# Patient Record
Sex: Female | Born: 1945 | Race: White | Hispanic: No | Smoking: Never smoker
Health system: Southern US, Community
[De-identification: ages and names within clinical notes are randomized; demographics above are authoritative.]

## PROBLEM LIST (undated history)

## (undated) DIAGNOSIS — J309 Allergic rhinitis, unspecified: Secondary | ICD-10-CM

## (undated) DIAGNOSIS — K859 Acute pancreatitis without necrosis or infection, unspecified: Secondary | ICD-10-CM

## (undated) DIAGNOSIS — M1990 Unspecified osteoarthritis, unspecified site: Secondary | ICD-10-CM

## (undated) DIAGNOSIS — E669 Obesity, unspecified: Secondary | ICD-10-CM

## (undated) DIAGNOSIS — E785 Hyperlipidemia, unspecified: Secondary | ICD-10-CM

## (undated) DIAGNOSIS — B019 Varicella without complication: Secondary | ICD-10-CM

## (undated) HISTORY — DX: Acute pancreatitis without necrosis or infection, unspecified: K85.90

## (undated) HISTORY — PX: JOINT REPLACEMENT: SHX530

## (undated) HISTORY — PX: EYE SURGERY: SHX253

## (undated) HISTORY — PX: TUBAL LIGATION: SHX77

## (undated) HISTORY — PX: COLONOSCOPY: SHX174

---

## 1999-11-09 ENCOUNTER — Other Ambulatory Visit: Admission: RE | Admit: 1999-11-09 | Discharge: 1999-11-09 | Payer: Self-pay | Admitting: Family Medicine

## 2004-11-14 ENCOUNTER — Ambulatory Visit: Payer: Self-pay | Admitting: Family Medicine

## 2006-01-31 ENCOUNTER — Ambulatory Visit: Payer: Self-pay | Admitting: Family Medicine

## 2006-06-14 ENCOUNTER — Ambulatory Visit: Payer: Self-pay | Admitting: Gastroenterology

## 2007-06-04 ENCOUNTER — Ambulatory Visit: Payer: Self-pay | Admitting: Family Medicine

## 2008-02-04 ENCOUNTER — Ambulatory Visit: Payer: Self-pay | Admitting: Family Medicine

## 2008-06-08 ENCOUNTER — Ambulatory Visit: Payer: Self-pay | Admitting: Family Medicine

## 2009-06-09 ENCOUNTER — Ambulatory Visit: Payer: Self-pay | Admitting: Family Medicine

## 2009-11-12 ENCOUNTER — Ambulatory Visit: Payer: Self-pay | Admitting: Unknown Physician Specialty

## 2009-11-17 ENCOUNTER — Ambulatory Visit: Payer: Self-pay | Admitting: Unknown Physician Specialty

## 2010-06-21 ENCOUNTER — Ambulatory Visit: Payer: Self-pay | Admitting: Ophthalmology

## 2010-07-12 ENCOUNTER — Ambulatory Visit: Payer: Self-pay | Admitting: Family Medicine

## 2011-08-23 ENCOUNTER — Ambulatory Visit: Payer: Self-pay | Admitting: Family Medicine

## 2011-09-06 ENCOUNTER — Ambulatory Visit: Payer: Self-pay | Admitting: Physical Medicine and Rehabilitation

## 2011-09-08 ENCOUNTER — Other Ambulatory Visit: Payer: Self-pay | Admitting: Internal Medicine

## 2011-09-12 ENCOUNTER — Other Ambulatory Visit: Payer: Self-pay | Admitting: Physical Medicine and Rehabilitation

## 2011-09-12 DIAGNOSIS — M545 Low back pain: Secondary | ICD-10-CM

## 2011-09-17 ENCOUNTER — Ambulatory Visit
Admission: RE | Admit: 2011-09-17 | Discharge: 2011-09-17 | Disposition: A | Discharge status: Home / Self Care | Payer: BC Managed Care – PPO | Source: Ambulatory Visit | Attending: Physical Medicine and Rehabilitation | Admitting: Physical Medicine and Rehabilitation

## 2011-09-17 DIAGNOSIS — M545 Low back pain: Secondary | ICD-10-CM

## 2012-12-09 ENCOUNTER — Ambulatory Visit: Payer: Self-pay | Admitting: Family Medicine

## 2013-07-30 ENCOUNTER — Ambulatory Visit: Payer: Self-pay | Admitting: General Practice

## 2013-07-30 LAB — URINALYSIS, COMPLETE
BACTERIA: NONE SEEN
BLOOD: NEGATIVE
Bilirubin,UR: NEGATIVE
Glucose,UR: NEGATIVE mg/dL (ref 0–75)
Hyaline Cast: 4
Ketone: NEGATIVE
Leukocyte Esterase: NEGATIVE
NITRITE: NEGATIVE
Ph: 7 (ref 4.5–8.0)
Protein: NEGATIVE
RBC,UR: 1 /HPF (ref 0–5)
SPECIFIC GRAVITY: 1.012 (ref 1.003–1.030)
WBC UR: 1 /HPF (ref 0–5)

## 2013-07-30 LAB — BASIC METABOLIC PANEL
ANION GAP: 6 — AB (ref 7–16)
BUN: 12 mg/dL (ref 7–18)
CALCIUM: 9.6 mg/dL (ref 8.5–10.1)
Chloride: 96 mmol/L — ABNORMAL LOW (ref 98–107)
Co2: 29 mmol/L (ref 21–32)
Creatinine: 0.78 mg/dL (ref 0.60–1.30)
EGFR (African American): >60
GLUCOSE: 101 mg/dL — AB (ref 65–99)
OSMOLALITY: 263 (ref 275–301)
Potassium: 3.3 mmol/L — ABNORMAL LOW (ref 3.5–5.1)
SODIUM: 131 mmol/L — AB (ref 136–145)

## 2013-07-30 LAB — CBC
HCT: 39.7 % (ref 35.0–47.0)
HGB: 13.5 g/dL (ref 12.0–16.0)
MCH: 30.6 pg (ref 26.0–34.0)
MCHC: 33.9 g/dL (ref 32.0–36.0)
MCV: 90 fL (ref 80–100)
Platelet: 355 10*3/uL (ref 150–440)
RBC: 4.4 10*6/uL (ref 3.80–5.20)
RDW: 12.5 % (ref 11.5–14.5)
WBC: 6.9 10*3/uL (ref 3.6–11.0)

## 2013-07-30 LAB — PROTIME-INR
INR: 1
PROTHROMBIN TIME: 12.6 s (ref 11.5–14.7)

## 2013-07-30 LAB — SEDIMENTATION RATE: ERYTHROCYTE SED RATE: 25 mm/h (ref 0–30)

## 2013-07-30 LAB — APTT: Activated PTT: 33.3 secs (ref 23.6–35.9)

## 2013-07-30 LAB — MRSA PCR SCREENING

## 2013-07-31 LAB — URINE CULTURE

## 2013-08-11 ENCOUNTER — Inpatient Hospital Stay: Payer: Self-pay | Admitting: General Practice

## 2013-08-11 LAB — POTASSIUM: Potassium: 4.6 mmol/L (ref 3.5–5.1)

## 2013-08-12 LAB — BASIC METABOLIC PANEL
ANION GAP: 5 — AB (ref 7–16)
BUN: 14 mg/dL (ref 7–18)
CREATININE: 0.92 mg/dL (ref 0.60–1.30)
Calcium, Total: 8.3 mg/dL — ABNORMAL LOW (ref 8.5–10.1)
Chloride: 104 mmol/L (ref 98–107)
Co2: 26 mmol/L (ref 21–32)
GLUCOSE: 146 mg/dL — AB (ref 65–99)
Osmolality: 273 (ref 275–301)
Potassium: 5 mmol/L (ref 3.5–5.1)
Sodium: 135 mmol/L — ABNORMAL LOW (ref 136–145)

## 2013-08-12 LAB — HEMOGLOBIN: HGB: 8.7 g/dL — ABNORMAL LOW (ref 12.0–16.0)

## 2013-08-12 LAB — PLATELET COUNT: Platelet: 222 10*3/uL (ref 150–440)

## 2013-08-13 LAB — BASIC METABOLIC PANEL
ANION GAP: 6 — AB (ref 7–16)
BUN: 7 mg/dL (ref 7–18)
Calcium, Total: 8.8 mg/dL (ref 8.5–10.1)
Chloride: 104 mmol/L (ref 98–107)
Co2: 26 mmol/L (ref 21–32)
Creatinine: 0.59 mg/dL — ABNORMAL LOW (ref 0.60–1.30)
EGFR (African American): >60
EGFR (Non-African Amer.): >60
Glucose: 109 mg/dL — ABNORMAL HIGH (ref 65–99)
OSMOLALITY: 271 (ref 275–301)
Potassium: 4.1 mmol/L (ref 3.5–5.1)
SODIUM: 136 mmol/L (ref 136–145)

## 2013-08-13 LAB — HEMOGLOBIN: HGB: 7.1 g/dL — AB (ref 12.0–16.0)

## 2013-08-13 LAB — PLATELET COUNT: PLATELETS: 173 10*3/uL (ref 150–440)

## 2013-08-14 ENCOUNTER — Encounter: Payer: Self-pay | Admitting: Internal Medicine

## 2013-08-14 LAB — HEMOGLOBIN
HGB: 8 g/dL — AB (ref 12.0–16.0)
HGB: 9.4 g/dL — ABNORMAL LOW (ref 12.0–16.0)

## 2013-08-15 ENCOUNTER — Encounter: Payer: Self-pay | Admitting: Internal Medicine

## 2013-08-15 LAB — PATHOLOGY REPORT

## 2013-08-19 ENCOUNTER — Ambulatory Visit: Payer: Self-pay | Admitting: Gerontology

## 2013-10-16 ENCOUNTER — Ambulatory Visit: Payer: Self-pay | Admitting: General Practice

## 2013-10-16 LAB — URINALYSIS, COMPLETE
Bacteria: NONE SEEN
Bilirubin,UR: NEGATIVE
Blood: NEGATIVE
Glucose,UR: NEGATIVE mg/dL (ref 0–75)
KETONE: NEGATIVE
Leukocyte Esterase: NEGATIVE
Nitrite: NEGATIVE
Ph: 6 (ref 4.5–8.0)
Protein: NEGATIVE
RBC,UR: <1 /HPF (ref 0–5)
Specific Gravity: 1.021 (ref 1.003–1.030)
WBC UR: NONE SEEN /HPF (ref 0–5)

## 2013-10-16 LAB — CBC
HCT: 38.8 % (ref 35.0–47.0)
HGB: 12.9 g/dL (ref 12.0–16.0)
MCH: 30.5 pg (ref 26.0–34.0)
MCHC: 33.3 g/dL (ref 32.0–36.0)
MCV: 92 fL (ref 80–100)
PLATELETS: 335 10*3/uL (ref 150–440)
RBC: 4.24 10*6/uL (ref 3.80–5.20)
RDW: 14 % (ref 11.5–14.5)
WBC: 5.6 10*3/uL (ref 3.6–11.0)

## 2013-10-16 LAB — BASIC METABOLIC PANEL
ANION GAP: 6 — AB (ref 7–16)
BUN: 13 mg/dL (ref 7–18)
CALCIUM: 9.8 mg/dL (ref 8.5–10.1)
CHLORIDE: 101 mmol/L (ref 98–107)
CREATININE: 0.73 mg/dL (ref 0.60–1.30)
Co2: 27 mmol/L (ref 21–32)
EGFR (Non-African Amer.): >60
GLUCOSE: 92 mg/dL (ref 65–99)
OSMOLALITY: 268 (ref 275–301)
Potassium: 4.1 mmol/L (ref 3.5–5.1)
Sodium: 134 mmol/L — ABNORMAL LOW (ref 136–145)

## 2013-10-16 LAB — SEDIMENTATION RATE: Erythrocyte Sed Rate: 26 mm/hr (ref 0–30)

## 2013-10-16 LAB — PROTIME-INR
INR: 1
Prothrombin Time: 12.6 secs (ref 11.5–14.7)

## 2013-10-16 LAB — APTT: Activated PTT: 31.1 secs (ref 23.6–35.9)

## 2013-10-16 LAB — MRSA PCR SCREENING

## 2013-10-17 LAB — URINE CULTURE

## 2013-10-27 ENCOUNTER — Inpatient Hospital Stay: Payer: Self-pay | Admitting: General Practice

## 2013-10-28 LAB — BASIC METABOLIC PANEL
Anion Gap: 8 (ref 7–16)
BUN: 9 mg/dL (ref 7–18)
Calcium, Total: 8 mg/dL — ABNORMAL LOW (ref 8.5–10.1)
Chloride: 102 mmol/L (ref 98–107)
Co2: 23 mmol/L (ref 21–32)
Creatinine: 0.73 mg/dL (ref 0.60–1.30)
EGFR (Non-African Amer.): >60
Glucose: 93 mg/dL (ref 65–99)
OSMOLALITY: 265 (ref 275–301)
POTASSIUM: 3.9 mmol/L (ref 3.5–5.1)
Sodium: 133 mmol/L — ABNORMAL LOW (ref 136–145)

## 2013-10-28 LAB — PLATELET COUNT: PLATELETS: 222 10*3/uL (ref 150–440)

## 2013-10-28 LAB — HEMOGLOBIN: HGB: 8.5 g/dL — AB (ref 12.0–16.0)

## 2013-10-29 LAB — BASIC METABOLIC PANEL
Anion Gap: 5 — ABNORMAL LOW (ref 7–16)
BUN: 5 mg/dL — ABNORMAL LOW (ref 7–18)
CHLORIDE: 106 mmol/L (ref 98–107)
CO2: 27 mmol/L (ref 21–32)
CREATININE: 0.62 mg/dL (ref 0.60–1.30)
Calcium, Total: 8.5 mg/dL (ref 8.5–10.1)
EGFR (African American): >60
EGFR (Non-African Amer.): >60
GLUCOSE: 106 mg/dL — AB (ref 65–99)
Osmolality: 273 (ref 275–301)
Potassium: 3.8 mmol/L (ref 3.5–5.1)
SODIUM: 138 mmol/L (ref 136–145)

## 2013-10-29 LAB — PATHOLOGY REPORT

## 2013-10-29 LAB — HEMOGLOBIN: HGB: 8.9 g/dL — ABNORMAL LOW (ref 12.0–16.0)

## 2013-10-29 LAB — PLATELET COUNT: PLATELETS: 216 10*3/uL (ref 150–440)

## 2014-04-15 ENCOUNTER — Ambulatory Visit: Payer: Self-pay | Admitting: Family Medicine

## 2014-08-08 NOTE — Discharge Summary (Signed)
PATIENT NAME:  Ellen [name], Ellen [name] MR#:  [id number] DATE OF BIRTH:  1945/12/29  DATE OF ADMISSION:  08/11/2013 DATE OF DISCHARGE: 08/14/2013.   ADMITTING DIAGNOSIS: Degenerative arthrosis of the left hip.   DISCHARGE DIAGNOSIS: Degenerative arthrosis of the right hip.   HISTORY: The patient is a 69 year old female who has been followed at [facility] for progression of left hip pain. She states she has not seen any change in her condition in quite a while. She had reported progressive decrease in her left hip range of motion. She had reported pain with weight-bearing activities. She was having difficulty with activities such as donning and doffing of her shoes and socks. She was having significant difficulty with stair ambulation. She did not see any significant improvement despite intra-articular cortisone injections. She states that the pain had increased to the point that it was significantly interfering with her activities of daily living. [facility] taken in [facility] showed severe degenerative changes with bone-on-bone articulation being noted, as well as some subchondral sclerosis. After discussion of the risks and benefits of surgical intervention, the patient expressed her understanding of the risks and benefits and agreed for plans for surgical intervention.   PROCEDURE: Left total hip arthroplasty.   ANESTHESIA: Spinal.   IMPLANTS UTILIZED: DePuy 13.5 mm small stature AML femoral stem, 50 mm outer diameter Pinnacle 100 acetabular component, +4 mm neutral Pinnacle Marathon polyethylene liner, and a 32 mm cobalt chrome hip ball with a +1 mm neck length.   HOSPITAL COURSE: The patient tolerated the procedure very well. She had no complications. She was then taken to the PAC-U where she was stabilized and then transferred to the orthopedic floor. She began receiving anticoagulation therapy of 30 mg subcutaneous every 12 hours per anesthesia and pharmacy protocol. She was fitted with TED  stockings bilaterally. These were allowed to be removed one hour per eight hour shift. The patient was also fitted with AVI compression foot pumps set at 80 mmHg. Her calves have been nontender. There has been no evidence of any DVTs. Negative Homans sign. Heels were elevated off the bed using rolled towels.   The patient has denied any chest pain or shortness of breath. Vital signs for the most part have been unremarkable and stable. However, she was noted to run some tachycardic secondary to low hemoglobin. Hemodynamically, she did require 2 units of packed RBCs. She was noted be lightheaded and was noted to have some slight tachycardia. After transfusion, she was noted to have improvement in her condition.   Physical therapy was initiated on day one for gait training and transfers. She has progressed very slowly. Secondary to deconditioning. Occupational therapy was also initiated on day one for activities of daily living and assistive devices. There has been no complications.   The patient's IV, Foley and Hemovac were discontinued on day two along with a dressing change. There was no signs of any infection. She was noted to have quite a bit of bruising to the left hip.   ____________________________ Ellen ClinesJon Ishmail Mcmanamon, PA jrw:sg D: 08/14/2013 08:07:33 ET [name]: 08/14/2013 09:00:04 ET JOB#: [id number]  cc: Ellen ClinesJon Tymesha Ditmore, PA, <Dictator> Ellen Kanan PA ELECTRONICALLY SIGNED 08/19/2013 21:41

## 2014-08-08 NOTE — Op Note (Signed)
PATIENT NAME:  Ellen [name], Ellen [name] MR#:  [id number] DATE OF BIRTH:  [birth year]  DATE OF PROCEDURE:  10/27/2013  PREOPERATIVE DIAGNOSIS: Degenerative arthrosis of the right hip.   POSTOPERATIVE DIAGNOSIS: Degenerative arthrosis of the right hip.   PROCEDURE PERFORMED: Right total hip arthroplasty.   SURGEON:  Dr. Francesco Sor  ASSISTANT:  Van Clines, PA (required to maintain retraction throughout the procedure).   ANESTHESIA: Spinal.   ESTIMATED BLOOD LOSS: 400 mL.   FLUIDS REPLACED: 2300 mL of crystalloid.   DRAINS: Two medium drains to Hemovac reservoir.   IMPLANTS UTILIZED: DePuy 13.5 mm small stature AML femoral stem, 50 mm outer diameter Pinnacle 100 acetabular component, neutral Pinnacle ALTRX polyethylene liner, and a 32 mm cobalt chrome hip ball with a +1 mm neck length.   INDICATIONS FOR SURGERY: The patient is a 69 year old female who has been seen for complaints of progressive right hip and groin pain. X-rays demonstrate severe degenerative changes with full-thickness loss of articular cartilage. She had previously undergone successful left total hip arthroplasty. After discussion of the risks and benefits of surgical intervention, the patient expressed understanding of the risks and benefits and agreed with plans for surgical intervention.   PROCEDURE IN DETAIL: The patient was brought to the operating room, and after adequate spinal anesthesia was achieved, the patient was placed in the left lateral decubitus position. Axillary roll was placed and all bony prominences were well padded. The patient's right hip and leg were cleaned and prepped with alcohol and DuraPrep, draped in the usual sterile fashion. A "timeout" was performed as per usual protocol. A lateral curvilinear incision was made gently curving towards the posterior superior iliac spine. IT band was incised in line with the skin incision. Fibers of the gluteus maximus were split in line. Piriformis tendon was identified,  skeletonized, and incised at its insertion at the proximal femur and reflected posteriorly. There is some similar fashion, short external rotators were incised and reflected posteriorly. A T-type posterior capsulotomy was performed. A large joint effusion was evacuated. Prior to dislocation of the femoral head, a threaded Steinmann pin was inserted through a separate stab incision into the pelvis superior to the acetabulum and then in the form of a stylus so as to assess limb length and hip offset throughout the procedure. The femoral head was then dislocated posteriorly. Severe degenerative changes were noted with full-thickness loss of articular cartilage and cyst formation. The femoral neck cut was performed using an oscillating saw.  Anterior capsule was elevated off the femoral neck. Inspection of the acetabulum also demonstrated significant degenerative changes with some superficial cystic changes noted. The labrum was excised. The acetabulum was reamed in a sequential fashion up to a 49 mm diameter. Good punctate bleeding bone was encountered. A 50 mm outer diameter Pinnacle 100 acetabular component was positioned and impacted into place. Good scratch fit was appreciated. A +4 mm neutral polyethylene was inserted and attention was directed to the proximal femur.  Pilot hole for reaming of the proximal femoral canal was created using a high-speed bur. Proximal femoral canal was reamed in a sequential fashion up to a 13 mm diameter. This was allowed for approximately 7.5 cm of scratch fit. It was thus elected to ream line to line to a 13.5 mm diameter using T handle.  The proximal femur was prepared using 13.5 mm aggressive side-biting reamer. Serial broaches were inserted up to a 13.5 mm small stature broach.   Calcar region was planed and trial reduction was  attempted.  It was felt that the limb length was too long and the broach was countersunk  with calcar region again planed. The +4 mm neutral polyethylene  was replaced with a neutral polyethylene. A 32 mm hip ball with a +1 mm neck length was placed on the trunnion and was reduced. Good equalization of limb lengths was appreciated and good hip offset was noted. Excellent stability was noted both anteriorly and posteriorly.   A hip ball was removed. The acetabular shell was irrigated and suctioned dry. A 32 mm inner diameter neutral ALTRX polyethylene liner was positioned and impacted into place. Next, a 13.5 mm small stature AML femoral component was positioned and impacted into place. Excellent scratch fit was appreciated. The Morse taper was cleaned and dried. A 32 mm cobalt chrome hip ball with a +1 mm neck length was placed on the trunnion and impacted into place. The hip was then reduced. Again, good equalization of limb lengths was noted and appropriate hip offset was appreciated. Excellent stability was noted both anteriorly and posteriorly. The wound was irrigated with copious amounts of normal saline with antibiotic solution using pulsatile lavage and then suctioned dry. Good hemostasis was appreciated. The posterior capsulotomy was repaired using #5 Ethibond. The piriformis tendon was reapproximated on the undersurface of the gluteus medius tendon using #5 Ethibond. Two medium drains were placed in the wound bed and brought out through a separate stab incision to be attached to a Hemovac reservoir. IT band was repaired using interrupted sutures of #1 Vicryl. The subcutaneous tissue was approximated in layers using first #0 Vicryl, followed by 2-0 Vicryl. Skin was closed with skin staples.  A sterile dressing was applied.   The patient tolerated the procedure well. She was transported to the recovery room in stable condition.   ____________________________ Illene LabradorJames P. Angie FavaHooten Jr., MD jph:dd D: 10/27/2013 10:53:54 ET T: 10/27/2013 20:20:12 ET JOB#: [id number]  cc: Illene LabradorJames P. Angie FavaHooten Jr., MD, <Dictator> Illene LabradorJAMES P Angie FavaHOOTEN JR MD ELECTRONICALLY SIGNED 11/04/2013  19:44

## 2014-08-08 NOTE — Op Note (Signed)
PATIENT NAME:  Ellen [name], Ellen [name] MR#:  [id number] DATE OF BIRTH:  1945-11-26  DATE OF PROCEDURE:  08/11/2013  PREOPERATIVE DIAGNOSIS: Degenerative arthrosis of the left hip.   POSTOPERATIVE DIAGNOSIS: Degenerative arthrosis of the left hip.   PROCEDURE PERFORMED: Left total hip arthroplasty.   SURGEON: Illene LabradorJames P. Hooten, MD   ASSISTANT: Hamilton CapriJohn Wolfe, PA (required to maintain retraction throughout the procedure)   ANESTHESIA: Spinal.   ESTIMATED BLOOD LOSS: 350 mL.   FLUIDS REPLACED: 1500 mL of crystalloid.   DRAINS: Two medium drains to Hemovac reservoir.   IMPLANTS UTILIZED: DePuy 13.5 mm small stature AML femoral stem, 50 mm outer diameter Pinnacle 100 acetabular component, +4 mm neutral Pinnacle Marathon polyethylene liner, and a 32 mm cobalt chrome hip ball with a +1 mm neck length.   INDICATIONS FOR SURGERY: The patient is a 69 year old female who has been seen for complaints of progressive left hip and groin pain. X-rays demonstrated severe degenerative changes. After discussion of the risks and benefits of surgical intervention, the patient expressed understanding of the risks and benefits and agreed with plans for surgical intervention.   PROCEDURE IN DETAIL: The patient was brought to the operating room and, after adequate spinal anesthesia was achieved, the patient was placed in a right lateral decubitus position. Axillary roll was placed and all bony prominences were well padded. The patient's left hip and leg were cleaned and prepped with alcohol and DuraPrep and draped in the usual sterile fashion. A "timeout" was performed as per usual protocol. A lateral curvilinear incision was made gently curving towards the posterior superior iliac spine. IT band was incised in line with the skin incision and fibers of the gluteus maximus were split in line. Piriformis tendon was identified, skeletonized, and incised at its insertion on the proximal femur and reflected posteriorly. In a similar  fashion, the short external rotators were incised and reflected posteriorly. A [name]-type posterior capsulotomy was performed. Prior to dislocation of the femoral head, a threaded Steinmann pin was inserted through a separate stab incision into the pelvis superior to the acetabulum and bent in the form of a stylus so as to assess limb length and hip offset throughout the procedure. The femoral head was then dislocated posteriorly. Severe degenerative changes were noted with full-thickness loss of articular cartilage. The femoral neck cut was performed using an oscillating saw. The anterior capsule was elevated off of the femoral neck. Inspection of the acetabulum also demonstrated significant degenerative changes. Labrum was excised using electrocautery. The acetabulum was reamed in a sequential fashion up to a 49 mm diameter. Good punctate bleeding bone was encountered. A 50 mm outer diameter Pinnacle 100 acetabular component was positioned and impacted into place. Excellent scratch fit was appreciated. A +4 mm neutral polyethylene trial was inserted and attention was directed to the proximal femur. Pilot hole for reaming of the proximal femoral canal was created using a high-speed bur. Proximal femoral canal was reamed in a sequential fashion up to a 13 mm diameter. This allowed for greater than 6 cm of scratch fit. The proximal femur was then prepared using a 13.5 mm aggressive side-biting reamer. Serial broaches were inserted up to a 13.5 mm small stature broach. The calcar region was planed and trial reduction was performed with a 32 mm hip ball with a +1 mm neck length. Good equalization of limb lengths was appreciated. Appropriate hip offset was noted. The hip was felt to be stable, both anteriorly and posteriorly. Trial components were removed. The  acetabular shell was irrigated with normal saline with antibiotic solution and then suctioned dry. A +4 mm neutral Pinnacle Marathon polyethylene liner was positioned  and impacted into place. Next, a 13.5 mm small stature AML femoral component was initially positioned. Greater than 7 cm of scratch fit was appreciated. It was elected to ream line the line with a 13.5 mm reamer. The AML femoral component was then repositioned and impacted into place. Excellent scratch fit was appreciated. The Morse taper was cleaned and dried. A 32 mm cobalt chrome hip ball with a +1 mm neck length was placed on the trunnion and impacted into place. The hip was reduced and placed through range of motion. Again, good equalization of limb lengths and appropriate hip offset was noted. Excellent stability was noted both anteriorly and posteriorly. The wound was irrigated with copious amounts of normal saline with antibiotic solution using pulsatile lavage and then suctioned dry. Good hemostasis was appreciated. The posterior capsulotomy was repaired using #5 Ethibond. The piriformis tendon was reapproximated on the undersurface of the gluteus medius tendon using #5 Ethibond. Two medium drains were placed in the wound bed and brought out through a separate stab incision to be attached to a reinfusion system. The IT band was repaired using interrupted sutures of #1 Vicryl. The subcutaneous tissue was approximated in layers using first #0 Vicryl followed by #2-0 Vicryl. The skin was closed with skin staples. Sterile dressing was applied.   The patient tolerated the procedure well. She was transported to the recovery room in stable condition.   ____________________________ Illene Labrador. Angie Fava., MD jph:sb D: 08/12/2013 06:23:00 ET [name]: 08/12/2013 08:15:27 ET JOB#: [id number]  cc: Fayrene Fearing P. Angie Fava., MD, <Dictator> JAMES P Angie Fava MD ELECTRONICALLY SIGNED 08/14/2013 23:05

## 2014-08-08 NOTE — Discharge Summary (Signed)
PATIENT NAME:  Ellen [name], Ellen [name] MR#:  [id number] DATE OF BIRTH:  [birth year]  DATE OF ADMISSION:  08/11/2013 DATE OF DISCHARGE:  08/14/2013   Addendum. This is a continuation.   The patient's IV, Foley and Hemovac were discontinued on day 2 along with a dressing change. The wound was free of any drainage or signs of infection.   DISPOSITION: The patient is being discharged to a skilled nursing facility in improved stable condition.   DISCHARGE INSTRUCTIONS: She will continue weight-bearing as tolerated. Continue with PT for gait training and transfers. OT for ADLs and assistive devices. Elevate the heels off the bed. A pillow between the legs when in bed. Continue with TED stockings. These are allowed to be removed 1 hour per 8-hour shift. Incentive spirometer q.1 hour while awake. Encourage cough and deep breathing q.2 hours while awake. She is placed on a regular diet. She was instructed on wound care. She is not to get the wound wet until the staples are removed on 08/25/2013. Benzoin and Steri-Strips will be applied. She has a followup appointment on June 9th at 10:45. She is to call the clinic sooner if any problems.   DRUG ALLERGIES: No known drug allergies.   MEDICATIONS:  1. Senokot-S 1 tablet b.i.d. 2. Duloxetine 30 mg at bedtime.  3. Enalapril/hydrochlorothiazide 1 tablet daily. 4. Pantoprazole 40 mg b.i.d. 5. Zocor 10 mg at bedtime. 6. Lovenox 30 mg subcutaneous q.12 hours for 14 days, then discontinue and begin taking one 81 mg enteric-coated aspirin.  7. Tylenol ES 500 to 1000 mg q.4 hours p.r.n. for temperatures of 100.4. 8. Mylanta DS 30 mL q.6 hours p.r.n.  9. Dulcolax suppository 10 mg rectally p.r.n. daily.  10. Milk of Magnesia 30 mL b.i.d. p.r.n. for constipation.  11. Oxycodone 5 to 10 mg q.4 hours p.r.n. 12. Tramadol 50 to 100 mg q.4 hours p.r.n. for pain.  13. Enema soapsuds if no results with Milk of Magnesia or Dulcolax.   PAST MEDICAL HISTORY:  1. Seasonal  allergies.  2. Hyperlipidemia.  3. Hypertension.  4. Arthritis.  5. Obesity.  6. Tubal ligation.    ____________________________ Van ClinesJon Wolfe, PA jrw:lb D: 08/14/2013 08:13:38 ET [name]: 08/14/2013 08:56:06 ET JOB#: [id number]  cc: Van ClinesJon Wolfe, PA, <Dictator> JON WOLFE PA ELECTRONICALLY SIGNED 08/19/2013 21:40

## 2014-08-08 NOTE — Discharge Summary (Signed)
PATIENT NAME:  Ellen [name], Ellen [name] MR#:  [id number] DATE OF BIRTH:  January 07, 1946  DATE OF ADMISSION:  08/11/2013 DATE OF DISCHARGE:  08/14/2013   Addendum. This is a continuation.   The patient's IV, Foley and Hemovac were discontinued on day 2 along with a dressing change. The wound was free of any drainage or signs of infection.   DISPOSITION: The patient is being discharged to a skilled nursing facility in improved stable condition.   DISCHARGE INSTRUCTIONS: She will continue weight-bearing as tolerated. Continue with PT for gait training and transfers. OT for ADLs and assistive devices. Elevate the heels off the bed. A pillow between the legs when in bed. Continue with TED stockings. These are allowed to be removed 1 hour per 8-hour shift. Incentive spirometer q.1 hour while awake. Encourage cough and deep breathing q.2 hours while awake. She is placed on a regular diet. She was instructed on wound care. She is not to get the wound wet until the staples are removed on 08/25/2013. Benzoin and Steri-Strips will be applied. She has a followup appointment on June 9th at 10:45. She is to call the clinic sooner if any problems.   DRUG ALLERGIES: No known drug allergies.   MEDICATIONS:  1. Senokot-S 1 tablet b.i.d. 2. Duloxetine 30 mg at bedtime.  3. Enalapril/hydrochlorothiazide 1 tablet daily. 4. Pantoprazole 40 mg b.i.d. 5. Zocor 10 mg at bedtime. 6. Lovenox 30 mg subcutaneous q.12 hours for 14 days, then discontinue and begin taking one 81 mg enteric-coated aspirin.  7. Tylenol ES 500 to 1000 mg q.4 hours p.r.n. for temperatures of 100.4. 8. Mylanta DS 30 mL q.6 hours p.r.n.  9. Dulcolax suppository 10 mg rectally p.r.n. daily.  10. Milk of Magnesia 30 mL b.i.d. p.r.n. for constipation.  11. Oxycodone 5 to 10 mg q.4 hours p.r.n. 12. Tramadol 50 to 100 mg q.4 hours p.r.n. for pain.  13. Enema soapsuds if no results with Milk of Magnesia or Dulcolax.   PAST MEDICAL HISTORY:  1. Seasonal  allergies.  2. Hyperlipidemia.  3. Hypertension.  4. Arthritis.  5. Obesity.  6. Tubal ligation.   ____________________________ Van ClinesJon Wolfe, PA jrw:lb D: 08/14/2013 08:13:00 ET [name]: 08/14/2013 08:54:32 ET JOB#: [id number]  cc: Van ClinesJon Wolfe, PA, <Dictator> JON WOLFE PA ELECTRONICALLY SIGNED 08/19/2013 21:40

## 2014-08-08 NOTE — Discharge Summary (Signed)
PATIENT NAME:  Ellen [name], Ellen [name] MR#:  [id number] DATE OF BIRTH:  1945-07-02  DATE OF ADMISSION:  10/27/2013 DATE OF DISCHARGE:  10/30/2013  ADMITTING DIAGNOSIS: Degenerative arthrosis of the right hip.   DISCHARGE DIAGNOSIS: Degenerative arthrosis of the right hip.  HISTORY: The patient is a 69 year old female who has been followed at [facility] for progression of right hip pain. The patient had previously underwent a left hip arthroplasty and had done extremely well; however, she continued to have discomfort to the right hip. Pain was noted to be aggravated with weight-bearing activities. She has noticed significant restriction in her range of motion. The patient states that the pain had increased to the point that it was significantly interfering with her activities of daily living. X-rays taken in [facility] orthopedics showed significant degenerative changes with decreased cartilage space with bone-on-bone articulation being appreciated. Subchondral sclerosis as well as osteophyte formation was noted. The patient was also noted to have some subchondral cyst formation. After discussion of the risks and benefits of surgical intervention, the patient expressed her understanding of the risks and benefits and agreed with plans for surgical intervention.   PROCEDURE: Right total hip arthroplasty.   ANESTHESIA: Spinal.   IMPLANTS UTILIZED: DePuy 13.5 mm small stature AML femoral stem,  50 mm outer diameter Pinnacle 100 acetabular component, neutral Pinnacle AltrX polyethylene liner, and a 32 mm cobalt chrome hip ball with a +1 mm neck length.   HOSPITAL COURSE: The patient tolerated the procedure very well. She had no complications. She was then taken to the PAC-U where she was stabilized and then transferred to the orthopedic floor. The patient began receiving anticoagulation therapy of Lovenox 30 mg subcutaneous every 12 hours per anesthesia and pharmacy protocol. She was fitted with TED  stockings bilaterally. These were allowed to be removed 1 hour per 8 hour shift. The patient was also fitted with the AVI compression foot pumps bilaterally set at 80 mmHg. Her heels were elevated off the bed using rolled towels. The patient has denied any calf pain. Negative Homans sign. There was no evidence of any DVTs.   The patient has denied any chest pain or shortness of breath. Vital signs have been stable. She has been afebrile. Hemodynamically she was stable and no transfusions were given.   Physical therapy was initiated on day 1 for gait training and transfers. She has done very well. Upon being discharged was ambulating greater than 200 feet. She was able go up and down 4 steps. She was independent with bed to chair transfers. She was able to recall posterior hip precautions. Occupational therapy was also initiated on day 1 for ADL and assistive devices. She has done extremely well and has had no complications.   The patient's IV was discontinued on day 2 secondary to some low blood pressures. This was discontinued and blood pressure remained stable. She was encouraged to take oral intake of fluids. Foley and dressing was changed on day 2. The wound was free of any drainage or signs of infection.   DISPOSITION: The patient is discharged to home in improved stable condition.   DISCHARGE INSTRUCTIONS: She may continue to weight bear as tolerated. Continue using a rolling walker until cleared by physical therapy to go to a quad cane. She will receive home health PT. Occupational therapy if needed. She is to continue with TED stockings. These are to be worn during the day but may be removed at night. She is placed on a regular diet.  Encouraged the patient to use her incentive spirometer q. 1 hour while awake. Encourage the patient to do cough and deep breathing q. 2 hours while awake. She is placed on a regular diet. She may resume her regular medication that she was on prior to admission. She was  given a prescription for oxycodone 5 to 10 mg every 4 to 6 hours p.r.n. for pain, Ultram 50 to 100 mg every 4 to 6 hours p.r.n. for pain and Lovenox 40 mg subcu daily for 14 days and then discontinue and begin taking one 81 mg enteric-coated aspirin.   DRUG ALLERGIES: No known drug allergies.   PAST MEDICAL HISTORY: Hyperlipidemia, obesity, hypertension.   ____________________________ Van ClinesJon Randee Upchurch, PA [id number] D: 10/30/2013 08:05:12 ET T: 10/30/2013 09:00:36 ET JOB#: [id number]  cc: Van ClinesJon Arzell Mcgeehan, PA, <Dictator> Catarina Huntley PA ELECTRONICALLY SIGNED 11/12/2013 7:13

## 2015-05-31 ENCOUNTER — Other Ambulatory Visit: Payer: Self-pay | Admitting: Family Medicine

## 2015-05-31 DIAGNOSIS — Z780 Asymptomatic menopausal state: Secondary | ICD-10-CM

## 2015-06-01 ENCOUNTER — Other Ambulatory Visit: Payer: Self-pay | Admitting: Family Medicine

## 2015-06-01 DIAGNOSIS — Z1231 Encounter for screening mammogram for malignant neoplasm of breast: Secondary | ICD-10-CM

## 2015-06-15 ENCOUNTER — Ambulatory Visit
Admission: RE | Admit: 2015-06-15 | Discharge: 2015-06-15 | Disposition: A | Discharge status: Home / Self Care | Payer: Medicare PPO | Source: Ambulatory Visit | Attending: Family Medicine | Admitting: Family Medicine

## 2015-06-15 ENCOUNTER — Other Ambulatory Visit: Payer: Self-pay | Admitting: Family Medicine

## 2015-06-15 DIAGNOSIS — Z1231 Encounter for screening mammogram for malignant neoplasm of breast: Secondary | ICD-10-CM | POA: Diagnosis present

## 2015-06-15 DIAGNOSIS — M8580 Other specified disorders of bone density and structure, unspecified site: Secondary | ICD-10-CM | POA: Insufficient documentation

## 2015-06-15 DIAGNOSIS — Z13820 Encounter for screening for osteoporosis: Secondary | ICD-10-CM | POA: Insufficient documentation

## 2015-06-15 DIAGNOSIS — Z96643 Presence of artificial hip joint, bilateral: Secondary | ICD-10-CM | POA: Diagnosis not present

## 2015-06-15 DIAGNOSIS — Z780 Asymptomatic menopausal state: Secondary | ICD-10-CM

## 2015-06-15 DIAGNOSIS — Z96641 Presence of right artificial hip joint: Secondary | ICD-10-CM | POA: Diagnosis not present

## 2016-07-11 ENCOUNTER — Other Ambulatory Visit: Payer: Self-pay | Admitting: Family Medicine

## 2016-07-11 DIAGNOSIS — Z1231 Encounter for screening mammogram for malignant neoplasm of breast: Secondary | ICD-10-CM

## 2016-08-07 ENCOUNTER — Ambulatory Visit
Admission: RE | Admit: 2016-08-07 | Discharge: 2016-08-07 | Disposition: A | Discharge status: Home / Self Care | Payer: Medicare HMO | Source: Ambulatory Visit | Attending: Family Medicine | Admitting: Family Medicine

## 2016-08-07 DIAGNOSIS — Z1231 Encounter for screening mammogram for malignant neoplasm of breast: Secondary | ICD-10-CM | POA: Insufficient documentation

## 2017-02-19 ENCOUNTER — Encounter: Payer: Self-pay | Admitting: *Deleted

## 2017-02-20 ENCOUNTER — Ambulatory Visit
Admission: RE | Admit: 2017-02-20 | Discharge: 2017-02-20 | Disposition: A | Discharge status: Home / Self Care | Payer: Medicare HMO | Source: Ambulatory Visit | Attending: Gastroenterology | Admitting: Gastroenterology

## 2017-02-20 ENCOUNTER — Encounter: Payer: Self-pay | Admitting: *Deleted

## 2017-02-20 ENCOUNTER — Ambulatory Visit: Payer: Medicare HMO | Admitting: Anesthesiology

## 2017-02-20 ENCOUNTER — Encounter
Admission: RE | Disposition: A | Discharge status: Home / Self Care | Payer: Self-pay | Source: Ambulatory Visit | Attending: Gastroenterology

## 2017-02-20 DIAGNOSIS — I739 Peripheral vascular disease, unspecified: Secondary | ICD-10-CM | POA: Diagnosis not present

## 2017-02-20 DIAGNOSIS — K9181 Other intraoperative complications of digestive system: Secondary | ICD-10-CM | POA: Diagnosis not present

## 2017-02-20 DIAGNOSIS — E785 Hyperlipidemia, unspecified: Secondary | ICD-10-CM | POA: Insufficient documentation

## 2017-02-20 DIAGNOSIS — Z7982 Long term (current) use of aspirin: Secondary | ICD-10-CM | POA: Insufficient documentation

## 2017-02-20 DIAGNOSIS — Z1211 Encounter for screening for malignant neoplasm of colon: Secondary | ICD-10-CM | POA: Insufficient documentation

## 2017-02-20 DIAGNOSIS — Y838 Other surgical procedures as the cause of abnormal reaction of the patient, or of later complication, without mention of misadventure at the time of the procedure: Secondary | ICD-10-CM | POA: Diagnosis not present

## 2017-02-20 DIAGNOSIS — I10 Essential (primary) hypertension: Secondary | ICD-10-CM | POA: Diagnosis not present

## 2017-02-20 DIAGNOSIS — J309 Allergic rhinitis, unspecified: Secondary | ICD-10-CM | POA: Diagnosis not present

## 2017-02-20 DIAGNOSIS — K5520 Angiodysplasia of colon without hemorrhage: Secondary | ICD-10-CM | POA: Diagnosis not present

## 2017-02-20 DIAGNOSIS — Z6838 Body mass index (BMI) 38.0-38.9, adult: Secondary | ICD-10-CM | POA: Insufficient documentation

## 2017-02-20 DIAGNOSIS — K5730 Diverticulosis of large intestine without perforation or abscess without bleeding: Secondary | ICD-10-CM | POA: Insufficient documentation

## 2017-02-20 DIAGNOSIS — Z7951 Long term (current) use of inhaled steroids: Secondary | ICD-10-CM | POA: Diagnosis not present

## 2017-02-20 DIAGNOSIS — Z79899 Other long term (current) drug therapy: Secondary | ICD-10-CM | POA: Diagnosis not present

## 2017-02-20 HISTORY — PX: COLONOSCOPY WITH PROPOFOL: SHX5780

## 2017-02-20 HISTORY — DX: Varicella without complication: B01.9

## 2017-02-20 HISTORY — DX: Hyperlipidemia, unspecified: E78.5

## 2017-02-20 HISTORY — DX: Unspecified osteoarthritis, unspecified site: M19.90

## 2017-02-20 HISTORY — DX: Obesity, unspecified: E66.9

## 2017-02-20 HISTORY — DX: Allergic rhinitis, unspecified: J30.9

## 2017-02-20 SURGERY — COLONOSCOPY WITH PROPOFOL
Anesthesia: General

## 2017-02-20 MED ORDER — LIDOCAINE HCL (PF) 1 % IJ SOLN
INTRAMUSCULAR | Status: AC
  Administered 2017-02-20: 0.3 mL via INTRADERMAL
  Filled 2017-02-20: qty 2

## 2017-02-20 MED ORDER — PROPOFOL 10 MG/ML IV BOLUS
INTRAVENOUS | Status: DC | PRN
  Administered 2017-02-20: 50 mg via INTRAVENOUS

## 2017-02-20 MED ORDER — PROPOFOL 500 MG/50ML IV EMUL
INTRAVENOUS | Status: DC | PRN
  Administered 2017-02-20: 140 ug/kg/min via INTRAVENOUS

## 2017-02-20 MED ORDER — LIDOCAINE HCL (PF) 1 % IJ SOLN
2.0000 mL | Freq: Once | INTRAMUSCULAR | Status: AC
  Administered 2017-02-20: 0.3 mL via INTRADERMAL

## 2017-02-20 MED ORDER — SODIUM CHLORIDE 0.9 % IV SOLN
INTRAVENOUS | Status: DC
  Administered 2017-02-20: 1000 mL via INTRAVENOUS

## 2017-02-20 MED ORDER — SODIUM CHLORIDE 0.9 % IV SOLN
INTRAVENOUS | Status: AC
  Administered 2017-02-20: 2 g via INTRAVENOUS
  Filled 2017-02-20: qty 2000

## 2017-02-20 MED ORDER — SODIUM CHLORIDE 0.9 % IV SOLN: INTRAVENOUS | Status: DC

## 2017-02-20 MED ORDER — PROPOFOL 500 MG/50ML IV EMUL
INTRAVENOUS | Status: AC
  Filled 2017-02-20: qty 50

## 2017-02-20 MED ORDER — SODIUM CHLORIDE 0.9 % IV SOLN
2.0000 g | Freq: Once | INTRAVENOUS | Status: AC
  Administered 2017-02-20: 2 g via INTRAVENOUS

## 2017-02-20 NOTE — Anesthesia Preprocedure Evaluation (Signed)
Anesthesia Evaluation  Patient identified by MRN, date of birth, ID band Patient awake    Reviewed: Allergy & Precautions, NPO status , Patient's Chart, lab work & pertinent test results  Airway Mallampati: II       Dental  (+) Teeth Intact   Pulmonary neg pulmonary ROS,     + decreased breath sounds      Cardiovascular Exercise Tolerance: Good hypertension, Pt. on medications + Peripheral Vascular Disease   Rhythm:Regular Rate:Normal     Neuro/Psych negative neurological ROS  negative psych ROS   GI/Hepatic negative GI ROS, Neg liver ROS,   Endo/Other  Morbid obesity  Renal/GU negative Renal ROS     Musculoskeletal   Abdominal (+) + obese,   Peds  Hematology   Anesthesia Other Findings   Reproductive/Obstetrics                             Anesthesia Physical Anesthesia Plan  ASA: II  Anesthesia Plan: General   Post-op Pain Management:    Induction: Intravenous  PONV Risk Score and Plan: 3 and 0  Airway Management Planned: Natural Airway and Nasal Cannula  Additional Equipment:   Intra-op Plan:   Post-operative Plan:   Informed Consent: I have reviewed the patients History and Physical, chart, labs and discussed the procedure including the risks, benefits and alternatives for the proposed anesthesia with the patient or authorized representative who has indicated his/her understanding and acceptance.     Plan Discussed with: CRNA  Anesthesia Plan Comments:         Anesthesia Quick Evaluation

## 2017-02-20 NOTE — Anesthesia Post-op Follow-up Note (Signed)
Anesthesia QCDR form completed.        

## 2017-02-20 NOTE — Anesthesia Procedure Notes (Signed)
Performed by: Malva CoganBeane, Eleasha Cataldo, CRNA Pre-anesthesia Checklist: Emergency Drugs available, Patient identified, Suction available, Patient being monitored and Timeout performed Oxygen Delivery Method: Nasal cannula Induction Type: IV induction

## 2017-02-20 NOTE — H&P (Signed)
Outpatient short stay form Pre-procedure 02/20/2017 3:11 PM Ellen DeemMartin U Skulskie MD  Primary Physician: Dr. Angus PalmsSionne George  Reason for visit:  Colonoscopy  History of present illness:  Patient is a 71 year old female presenting today for a screening colonoscopy. He 1 mg aspirin this been held she takes no other aspirin products or blood thinning agents. She tolerated her prep well.    Current Facility-Administered Medications:  .  0.9 %  sodium chloride infusion, , Intravenous, Continuous, Ellen DeemSkulskie, Martin U, MD, Last Rate: 20 mL/hr at 02/20/17 1418, 1,000 mL at 02/20/17 1418 .  0.9 %  sodium chloride infusion, , Intravenous, Continuous, Ellen DeemSkulskie, Martin U, MD  Medications Prior to Admission  Medication Sig Dispense Refill Last Dose  . acetaminophen (TYLENOL) 500 MG tablet Take 500 mg every 6 (six) hours as needed by mouth.     Marland Kitchen. aspirin EC 81 MG tablet Take 81 mg daily by mouth.   Past Month at Unknown time  . cholecalciferol (VITAMIN D) 1000 units tablet Take 1,000 Units daily by mouth.   Past Week at Unknown time  . enalapril (VASOTEC) 10 MG tablet Take 10 mg daily by mouth.   02/20/2017 at 0800  . fluticasone (FLONASE) 50 MCG/ACT nasal spray Place 2 sprays daily into both nostrils.   Past Month at Unknown time  . hydrochlorothiazide (HYDRODIURIL) 12.5 MG tablet Take 12.5 mg daily by mouth.   02/20/2017 at 0800  . ibuprofen (ADVIL,MOTRIN) 200 MG tablet Take 200 mg every 6 (six) hours as needed by mouth.     . loratadine (CLARITIN) 10 MG tablet Take 10 mg daily by mouth.   Past Month at Unknown time  . polyethylene glycol powder (GLYCOLAX/MIRALAX) powder Take 1 Container once by mouth.   02/19/2017 at Unknown time  . Potassium Chloride CR (MICRO-K) 8 MEQ CPCR capsule CR Take 16 mEq 2 (two) times daily by mouth.   Past Week at Unknown time  . simvastatin (ZOCOR) 10 MG tablet Take 10 mg daily by mouth.   Past Week at Unknown time     No Known Allergies   Past Medical History:  Diagnosis  Date  . Allergic rhinitis   . Arthritis   . Hyperlipemia   . Obesity   . Varicella     Review of systems:      Physical Exam    Heart and lungs: Regular rate and rhythm without rub or gallop lungs are bilaterally clear.    HEENT: Normocephalic atraumatic eyes are anicteric    Other:     Pertinant exam for procedure: Soft nontender nondistended bowel sounds positive normoactive    Planned proceedures: Colonoscopy and indicated procedures. I have discussed the risks benefits and complications of procedures to include not limited to bleeding, infection, perforation and the risk of sedation and the patient wishes to proceed.    Ellen DeemMartin U Skulskie, MD Gastroenterology 02/20/2017  3:11 PM

## 2017-02-20 NOTE — Transfer of Care (Signed)
Immediate Anesthesia Transfer of Care Note  Patient: Ellen [name]  Procedure(s) Performed: COLONOSCOPY WITH PROPOFOL (N/A )  Patient Location: PACU  Anesthesia Type:General  Level of Consciousness: awake, alert  and oriented  Airway & Oxygen Therapy: Patient Spontanous Breathing and Patient connected to nasal cannula oxygen  Post-op Assessment: Report given to RN and Post -op Vital signs reviewed and stable  Post vital signs: Reviewed and stable  Last Vitals:  Vitals:   02/20/17 1342  BP: (!) 105/50  Pulse: 80  Resp: 18  Temp: 36.7 C  SpO2: 96%    Last Pain:  Vitals:   02/20/17 1342  TempSrc: Tympanic         Complications: No apparent anesthesia complications

## 2017-02-20 NOTE — Op Note (Signed)
[facility] Gastroenterology Patient Name: Ellen [name] Procedure Date: 02/20/2017 3:15 PM MRN: [id number] Account #: [id number] Date of Birth: 02/18/1946 Admit Type: Outpatient Age: 71 [facility] Gender: Female Note Status: Finalized Procedure:            Colonoscopy Indications:          Screening for colorectal malignant neoplasm Providers:            Christena DeemMartin U. Neera Teng, MD Referring MD:         Marylin CrosbySionne A. Greggory StallionGeorge MD, MD (Referring MD) Medicines:            Monitored Anesthesia Care Complications:        No immediate complications. Some mild barotrauma was                        noted in the cecum and proximal ascending colon,                        limited to the mucosa. Procedure:            Pre-Anesthesia Assessment:                       - ASA Grade Assessment: II - A patient with mild                        systemic disease.                       After obtaining informed consent, the colonoscope was                        passed under direct vision. Throughout the procedure,                        the patient's blood pressure, pulse, and oxygen                        saturations were monitored continuously. The Olympus                        PCF-H180AL colonoscope ( S#: [id number] ) was introduced                        through the anus and advanced to the the cecum,                        identified by appendiceal orifice and ileocecal valve.                        The patient tolerated the procedure well. The quality                        of the bowel preparation was good. Findings:      Multiple small and large-mouthed diverticula were found in the sigmoid       colon and descending colon.      The retroflexed view of the distal rectum and anal verge was normal and       showed no anal or rectal abnormalities.      The digital rectal exam was normal.  multiple tiny AVMs are noted in the upper rectum and distal sigmoid,       noted deep to  the mucosa, withour evidence of bleeding. Impression:           - Diverticulosis in the sigmoid colon and in the                        descending colon.                       - The distal rectum and anal verge are normal on                        retroflexion view.                       - No specimens collected. Recommendation:       - Discharge patient to home.                       - Repeat colonoscopy in 10 years for screening purposes. Procedure Code(s):    --- Professional ---                       [id number], Colonoscopy, flexible; diagnostic, including                        collection of specimen(s) by brushing or washing, when                        performed (separate procedure) Diagnosis Code(s):    --- Professional ---                       Z12.11, Encounter for screening for malignant neoplasm                        of colon                       K57.30, Diverticulosis of large intestine without                        perforation or abscess without bleeding CPT copyright 2016 American Medical Association. All rights reserved. The codes documented in this report are preliminary and upon coder review may  be revised to meet current compliance requirements. Christena DeemMartin U Ashleigh Luckow, MD 02/20/2017 3:47:08 PM This report has been signed electronically. Number of Addenda: 0 Note Initiated On: 02/20/2017 3:15 PM Scope Withdrawal Time: 0 hours 6 minutes 47 seconds  Total Procedure Duration: 0 hours 19 minutes 14 seconds       [facility]

## 2017-02-20 NOTE — Anesthesia Postprocedure Evaluation (Signed)
Anesthesia Post Note  Patient: Ellen [name]  Procedure(s) Performed: COLONOSCOPY WITH PROPOFOL (N/A )  Patient location during evaluation: Endoscopy Anesthesia Type: General Level of consciousness: awake and alert Pain management: pain level controlled Vital Signs Assessment: post-procedure vital signs reviewed and stable Respiratory status: spontaneous breathing, nonlabored ventilation, respiratory function stable and patient connected to nasal cannula oxygen Cardiovascular status: blood pressure returned to baseline and stable Postop Assessment: no apparent nausea or vomiting Anesthetic complications: no     Last Vitals:  Vitals:   02/20/17 1608 02/20/17 1618  BP: 96/65 (!) 101/56  Pulse:    Resp:    Temp:    SpO2:      Last Pain:  Vitals:   02/20/17 1548  TempSrc: Tympanic                 Lenard SimmerAndrew Lyrah Bradt

## 2017-02-21 ENCOUNTER — Encounter: Payer: Self-pay | Admitting: Gastroenterology

## 2017-08-23 ENCOUNTER — Other Ambulatory Visit: Payer: Self-pay | Admitting: Family Medicine

## 2017-08-23 DIAGNOSIS — Z1231 Encounter for screening mammogram for malignant neoplasm of breast: Secondary | ICD-10-CM

## 2017-09-14 ENCOUNTER — Ambulatory Visit
Admission: RE | Admit: 2017-09-14 | Discharge: 2017-09-14 | Disposition: A | Discharge status: Home / Self Care | Payer: Medicare HMO | Source: Ambulatory Visit | Attending: Family Medicine | Admitting: Family Medicine

## 2017-09-14 DIAGNOSIS — Z1231 Encounter for screening mammogram for malignant neoplasm of breast: Secondary | ICD-10-CM | POA: Insufficient documentation

## 2018-03-26 ENCOUNTER — Emergency Department: Payer: Medicare HMO

## 2018-03-26 ENCOUNTER — Other Ambulatory Visit: Payer: Self-pay

## 2018-03-26 ENCOUNTER — Encounter: Payer: Self-pay | Admitting: Emergency Medicine

## 2018-03-26 ENCOUNTER — Inpatient Hospital Stay
Admission: EM | Admit: 2018-03-26 | Discharge: 2018-03-29 | DRG: 418 | Disposition: A | Discharge status: Home / Self Care | Payer: Medicare HMO | Attending: Internal Medicine | Admitting: Internal Medicine

## 2018-03-26 DIAGNOSIS — K859 Acute pancreatitis without necrosis or infection, unspecified: Secondary | ICD-10-CM | POA: Diagnosis not present

## 2018-03-26 DIAGNOSIS — E785 Hyperlipidemia, unspecified: Secondary | ICD-10-CM | POA: Diagnosis present

## 2018-03-26 DIAGNOSIS — M1990 Unspecified osteoarthritis, unspecified site: Secondary | ICD-10-CM | POA: Diagnosis present

## 2018-03-26 DIAGNOSIS — R1011 Right upper quadrant pain: Secondary | ICD-10-CM

## 2018-03-26 DIAGNOSIS — K660 Peritoneal adhesions (postprocedural) (postinfection): Secondary | ICD-10-CM | POA: Diagnosis present

## 2018-03-26 DIAGNOSIS — Z7982 Long term (current) use of aspirin: Secondary | ICD-10-CM | POA: Diagnosis not present

## 2018-03-26 DIAGNOSIS — Y838 Other surgical procedures as the cause of abnormal reaction of the patient, or of later complication, without mention of misadventure at the time of the procedure: Secondary | ICD-10-CM | POA: Diagnosis not present

## 2018-03-26 DIAGNOSIS — Z23 Encounter for immunization: Secondary | ICD-10-CM

## 2018-03-26 DIAGNOSIS — E669 Obesity, unspecified: Secondary | ICD-10-CM | POA: Diagnosis present

## 2018-03-26 DIAGNOSIS — Z6837 Body mass index (BMI) 37.0-37.9, adult: Secondary | ICD-10-CM | POA: Diagnosis not present

## 2018-03-26 DIAGNOSIS — Z803 Family history of malignant neoplasm of breast: Secondary | ICD-10-CM

## 2018-03-26 DIAGNOSIS — K851 Biliary acute pancreatitis without necrosis or infection: Secondary | ICD-10-CM | POA: Diagnosis present

## 2018-03-26 DIAGNOSIS — J309 Allergic rhinitis, unspecified: Secondary | ICD-10-CM | POA: Diagnosis present

## 2018-03-26 DIAGNOSIS — I10 Essential (primary) hypertension: Secondary | ICD-10-CM | POA: Diagnosis present

## 2018-03-26 DIAGNOSIS — K760 Fatty (change of) liver, not elsewhere classified: Secondary | ICD-10-CM | POA: Diagnosis present

## 2018-03-26 DIAGNOSIS — Z419 Encounter for procedure for purposes other than remedying health state, unspecified: Secondary | ICD-10-CM

## 2018-03-26 DIAGNOSIS — K8000 Calculus of gallbladder with acute cholecystitis without obstruction: Secondary | ICD-10-CM | POA: Diagnosis present

## 2018-03-26 DIAGNOSIS — Z79899 Other long term (current) drug therapy: Secondary | ICD-10-CM | POA: Diagnosis not present

## 2018-03-26 DIAGNOSIS — I9752 Accidental puncture and laceration of a circulatory system organ or structure during other procedure: Secondary | ICD-10-CM | POA: Diagnosis not present

## 2018-03-26 DIAGNOSIS — R1013 Epigastric pain: Secondary | ICD-10-CM | POA: Diagnosis present

## 2018-03-26 DIAGNOSIS — Z7951 Long term (current) use of inhaled steroids: Secondary | ICD-10-CM

## 2018-03-26 HISTORY — DX: Acute pancreatitis without necrosis or infection, unspecified: K85.90

## 2018-03-26 LAB — COMPREHENSIVE METABOLIC PANEL
ALK PHOS: 126 U/L (ref 38–126)
ALT: 402 U/L — ABNORMAL HIGH (ref 0–44)
AST: 404 U/L — ABNORMAL HIGH (ref 15–41)
Albumin: 4.2 g/dL (ref 3.5–5.0)
Anion gap: 10 (ref 5–15)
BILIRUBIN TOTAL: 6.7 mg/dL — AB (ref 0.3–1.2)
BUN: 17 mg/dL (ref 8–23)
CALCIUM: 9.5 mg/dL (ref 8.9–10.3)
CO2: 24 mmol/L (ref 22–32)
CREATININE: 0.65 mg/dL (ref 0.44–1.00)
Chloride: 104 mmol/L (ref 98–111)
Glucose, Bld: 128 mg/dL — ABNORMAL HIGH (ref 70–99)
Potassium: 4.3 mmol/L (ref 3.5–5.1)
Sodium: 138 mmol/L (ref 135–145)
Total Protein: 7.5 g/dL (ref 6.5–8.1)

## 2018-03-26 LAB — CBC
HCT: 41.8 % (ref 36.0–46.0)
Hemoglobin: 13.9 g/dL (ref 12.0–15.0)
MCH: 30 pg (ref 26.0–34.0)
MCHC: 33.3 g/dL (ref 30.0–36.0)
MCV: 90.3 fL (ref 80.0–100.0)
NRBC: 0 % (ref 0.0–0.2)
PLATELETS: 294 10*3/uL (ref 150–400)
RBC: 4.63 MIL/uL (ref 3.87–5.11)
RDW: 12.1 % (ref 11.5–15.5)
WBC: 6.9 10*3/uL (ref 4.0–10.5)

## 2018-03-26 LAB — URINALYSIS, COMPLETE (UACMP) WITH MICROSCOPIC
Bacteria, UA: NONE SEEN
Glucose, UA: NEGATIVE mg/dL
Ketones, ur: 80 mg/dL — AB
Leukocytes, UA: NEGATIVE
NITRITE: NEGATIVE
PH: 5 (ref 5.0–8.0)
Protein, ur: NEGATIVE mg/dL
SPECIFIC GRAVITY, URINE: 1.024 (ref 1.005–1.030)

## 2018-03-26 LAB — LIPASE, BLOOD: Lipase: 2562 U/L — ABNORMAL HIGH (ref 11–51)

## 2018-03-26 MED ORDER — INFLUENZA VAC SPLIT HIGH-DOSE 0.5 ML IM SUSY
0.5000 mL | PREFILLED_SYRINGE | INTRAMUSCULAR | Status: AC
  Administered 2018-03-27: 0.5 mL via INTRAMUSCULAR
  Filled 2018-03-26: qty 0.5

## 2018-03-26 MED ORDER — SODIUM CHLORIDE 0.9 % IV BOLUS
500.0000 mL | Freq: Once | INTRAVENOUS | Status: AC
  Administered 2018-03-26: 500 mL via INTRAVENOUS

## 2018-03-26 MED ORDER — PROMETHAZINE HCL 25 MG/ML IJ SOLN
12.5000 mg | Freq: Four times a day (QID) | INTRAMUSCULAR | Status: DC | PRN
Start: 2018-03-26 — End: 2018-03-29
  Administered 2018-03-26 – 2018-03-28 (×2): 12.5 mg via INTRAVENOUS
  Filled 2018-03-26: qty 1

## 2018-03-26 MED ORDER — PROMETHAZINE HCL 25 MG/ML IJ SOLN
INTRAMUSCULAR | Status: AC
  Administered 2018-03-26: 12.5 mg via INTRAVENOUS
  Filled 2018-03-26: qty 1

## 2018-03-26 MED ORDER — MORPHINE SULFATE (PF) 2 MG/ML IV SOLN: 2.0000 mg | INTRAVENOUS | Status: DC | PRN

## 2018-03-26 MED ORDER — FLUTICASONE PROPIONATE 50 MCG/ACT NA SUSP
2.0000 | Freq: Every day | NASAL | Status: DC | PRN
  Filled 2018-03-26: qty 16

## 2018-03-26 MED ORDER — ONDANSETRON HCL 4 MG/2ML IJ SOLN
4.0000 mg | Freq: Four times a day (QID) | INTRAMUSCULAR | Status: DC | PRN
  Administered 2018-03-28: 4 mg via INTRAVENOUS

## 2018-03-26 MED ORDER — ONDANSETRON HCL 4 MG PO TABS
4.0000 mg | ORAL_TABLET | Freq: Four times a day (QID) | ORAL | Status: DC | PRN
  Filled 2018-03-26: qty 1

## 2018-03-26 MED ORDER — SODIUM CHLORIDE 0.9 % IV SOLN
INTRAVENOUS | Status: DC
  Administered 2018-03-26 – 2018-03-27 (×3): via INTRAVENOUS

## 2018-03-26 MED ORDER — ENOXAPARIN SODIUM 40 MG/0.4ML ~~LOC~~ SOLN
40.0000 mg | SUBCUTANEOUS | Status: DC
  Administered 2018-03-26 – 2018-03-27 (×2): 40 mg via SUBCUTANEOUS
  Filled 2018-03-26 (×2): qty 0.4

## 2018-03-26 MED ORDER — IOPAMIDOL (ISOVUE-300) INJECTION 61%
100.0000 mL | Freq: Once | INTRAVENOUS | Status: AC | PRN
  Administered 2018-03-26: 100 mL via INTRAVENOUS
  Filled 2018-03-26: qty 100

## 2018-03-26 MED ORDER — BISACODYL 5 MG PO TBEC
5.0000 mg | DELAYED_RELEASE_TABLET | Freq: Every day | ORAL | Status: DC | PRN
Start: 2018-03-26 — End: 2018-03-29

## 2018-03-26 NOTE — ED Notes (Signed)
ED Provider at bedside. 

## 2018-03-26 NOTE — ED Triage Notes (Signed)
Pt c/o abdominal pain that started yesterday morning, pt states when she tries to eat or drink anything she vomits up clear emesis.  Pt c/o generalized abdominal pain that radiates to her back, pt states the pain feels like gas pains.  Pt denies diarrhea.

## 2018-03-26 NOTE — ED Provider Notes (Signed)
[facility] Emergency Department Provider Note    First MD Initiated Contact with Patient 03/26/18 1618     (approximate)  I have reviewed the triage vital signs and the nursing notes.   HISTORY  Chief Complaint Abdominal Pain    HPI Ellen [name] is a 73 y.o. female resents with less than 24 hours of epigastric pain associate with nausea vomiting and decreased oral intake.  Denies any fevers.  States the pain is like a band like distribution around through to her back.  Is never had pain like this before.  No previous abdominal surgeries.  Denies any recent medication changes.  No viral illnesses.  Denies any diarrhea.  No dysuria.    Past Medical History:  Diagnosis Date  . Allergic rhinitis   . Arthritis   . Hyperlipemia   . Obesity   . Varicella    Family History  Problem Relation Age of Onset  . Breast cancer Sister 41   Past Surgical History:  Procedure Laterality Date  . COLONOSCOPY    . COLONOSCOPY WITH PROPOFOL N/A 02/20/2017   Procedure: COLONOSCOPY WITH PROPOFOL;  Surgeon: Christena Deem, MD;  Location: [facility] ENDOSCOPY;  Service: Endoscopy;  Laterality: N/A;  . EYE SURGERY    . JOINT REPLACEMENT Bilateral 08/11/2013,10/27/2013  . TUBAL LIGATION     There are no active problems to display for this patient.     Prior to Admission medications   Medication Sig Start Date End Date Taking? Authorizing Provider  acetaminophen (TYLENOL) 500 MG tablet Take 500 mg every 6 (six) hours as needed by mouth.    [provider]  aspirin EC 81 MG tablet Take 81 mg daily by mouth.    [provider]  cholecalciferol (VITAMIN D) 1000 units tablet Take 1,000 Units daily by mouth.    [provider]  enalapril (VASOTEC) 10 MG tablet Take 10 mg daily by mouth.    [provider]  fluticasone (FLONASE) 50 MCG/ACT nasal spray Place 2 sprays daily into both nostrils.    [provider]  hydrochlorothiazide  (HYDRODIURIL) 12.5 MG tablet Take 12.5 mg daily by mouth.    [provider]  ibuprofen (ADVIL,MOTRIN) 200 MG tablet Take 200 mg every 6 (six) hours as needed by mouth.    [provider]  loratadine (CLARITIN) 10 MG tablet Take 10 mg daily by mouth.    [provider]  polyethylene glycol powder (GLYCOLAX/MIRALAX) powder Take 1 Container once by mouth.    [provider]  Potassium Chloride CR (MICRO-K) 8 MEQ CPCR capsule CR Take 16 mEq 2 (two) times daily by mouth.    [provider]  simvastatin (ZOCOR) 10 MG tablet Take 10 mg daily by mouth.    [provider]    Allergies Patient has no known allergies.    Social History Social History   Tobacco Use  . Smoking status: Never Smoker  . Smokeless tobacco: Never Used  Substance Use Topics  . Alcohol use: No    Frequency: Never  . Drug use: No    Review of Systems Patient denies headaches, rhinorrhea, blurry vision, numbness, shortness of breath, chest pain, edema, cough, abdominal pain, nausea, vomiting, diarrhea, dysuria, fevers, rashes or hallucinations unless otherwise stated above in HPI. ____________________________________________   PHYSICAL EXAM:  VITAL SIGNS: Vitals:   03/26/18 1436 03/26/18 1614  BP: 135/66 127/70  Pulse: 76 73  Resp: 18 18  Temp: 97.9 F (36.6 C)  SpO2: 98% 98%    Constitutional: Alert and oriented.  Eyes: Conjunctivae are normal.  Head: Atraumatic. Nose: No congestion/rhinnorhea. Mouth/Throat: Mucous membranes are moist.   Neck: No stridor. Painless ROM.  Cardiovascular: Normal rate, regular rhythm. Grossly normal heart sounds.  Good peripheral circulation. Respiratory: Normal respiratory effort.  No retractions. Lungs CTAB. Gastrointestinal: Soft with mild epigastric and right upper quadrant tenderness to palpation.. No distention. No abdominal bruits. No CVA tenderness. Genitourinary:  Musculoskeletal: No lower extremity  tenderness nor edema.  No joint effusions. Neurologic:  Normal speech and language. No gross focal neurologic deficits are appreciated. No facial droop Skin:  Skin is warm, dry and intact. No rash noted. Psychiatric: Mood and affect are normal. Speech and behavior are normal.  ____________________________________________   LABS (all labs ordered are listed, but only abnormal results are displayed)  Results for orders placed or performed during the hospital encounter of 03/26/18 (from the past 24 hour(s))  Lipase, blood     Status: Abnormal   Collection Time: 03/26/18  2:38 PM  Result Value Ref Range   Lipase 2,562 (H) 11 - 51 U/L  Comprehensive metabolic panel     Status: Abnormal   Collection Time: 03/26/18  2:38 PM  Result Value Ref Range   Sodium 138 135 - 145 mmol/L   Potassium 4.3 3.5 - 5.1 mmol/L   Chloride 104 98 - 111 mmol/L   CO2 24 22 - 32 mmol/L   Glucose, Bld 128 (H) 70 - 99 mg/dL   BUN 17 8 - 23 mg/dL   Creatinine, Ser 4.690.65 0.44 - 1.00 mg/dL   Calcium 9.5 8.9 - 62.910.3 mg/dL   Total Protein 7.5 6.5 - 8.1 g/dL   Albumin 4.2 3.5 - 5.0 g/dL   AST 528404 (H) 15 - 41 U/L   ALT 402 (H) 0 - 44 U/L   Alkaline Phosphatase 126 38 - 126 U/L   Total Bilirubin 6.7 (H) 0.3 - 1.2 mg/dL   GFR calc non Af Amer >60 >60 mL/min   GFR calc Af Amer >60 >60 mL/min   Anion gap 10 5 - 15  CBC     Status: None   Collection Time: 03/26/18  2:38 PM  Result Value Ref Range   WBC 6.9 4.0 - 10.5 K/uL   RBC 4.63 3.87 - 5.11 MIL/uL   Hemoglobin 13.9 12.0 - 15.0 g/dL   HCT 41.341.8 24.436.0 - 01.046.0 %   MCV 90.3 80.0 - 100.0 fL   MCH 30.0 26.0 - 34.0 pg   MCHC 33.3 30.0 - 36.0 g/dL   RDW 27.212.1 53.611.5 - 64.415.5 %   Platelets 294 150 - 400 K/uL   nRBC 0.0 0.0 - 0.2 %  Urinalysis, Complete w Microscopic     Status: Abnormal   Collection Time: 03/26/18  2:38 PM  Result Value Ref Range   Color, Urine AMBER (A) YELLOW   APPearance CLEAR (A) CLEAR   Specific Gravity, Urine 1.024 1.005 - 1.030   pH 5.0 5.0 - 8.0     Glucose, UA NEGATIVE NEGATIVE mg/dL   Hgb urine dipstick SMALL (A) NEGATIVE   Bilirubin Urine SMALL (A) NEGATIVE   Ketones, ur 80 (A) NEGATIVE mg/dL   Protein, ur NEGATIVE NEGATIVE mg/dL   Nitrite NEGATIVE NEGATIVE   Leukocytes, UA NEGATIVE NEGATIVE   RBC / HPF 0-5 0 - 5 RBC/hpf   WBC, UA 0-5 0 - 5 WBC/hpf   Bacteria, UA NONE SEEN NONE SEEN   Squamous Epithelial /  LPF 0-5 0 - 5   Mucus PRESENT    ____________________________________________  EKG My review and personal interpretation at Time: 14:42   Indication: epigastric pain  Rate: 75  Rhythm: sinus Axis: normal Other: normal intervals, no stemi ____________________________________________  RADIOLOGY  I personally reviewed all radiographic images ordered to evaluate for the above acute complaints and reviewed radiology reports and findings.  These findings were personally discussed with the patient.  Please see medical record for radiology report.  ____________________________________________   PROCEDURES  Procedure(s) performed:  Procedures    Critical Care performed: no ____________________________________________   INITIAL IMPRESSION / ASSESSMENT AND PLAN / ED COURSE  Pertinent labs & imaging results that were available during my care of the patient were reviewed by me and considered in my medical decision making (see chart for details).   DDX: cholelithiasis, cholecystitis, cholangitis, pancreatitis, mass, hepatitis, enteritis  Ellen [name] is a 72 y.o. who presents to the ED with symptoms as described above.  Patient with evidence of transaminitis and pancreatitis.  Ultrasound will be ordered to evaluate for obstructive pathology.  Will order CT imaging if that is equivocal.  Will provide IV medication for pain nausea as well as IV fluids.  The patient will be placed on continuous pulse oximetry and telemetry for monitoring.  Laboratory evaluation will be sent to evaluate for the above complaints.      Clinical Course as of Mar 26 1856  Tue Mar 26, 2018  1810 CT imaging does show evidence of cholelithiasis but ultrasound does not show any significant bile duct dilatation.  Patient does have elevated lipase.  We will continue with IV hydration and patient will require admission the hospital.  Will consult with gastroenterology.   [PR]  1856 Discussed case with Dr. Allegra Lai of gastroenterology.  Agrees with admission the hospital for IV hydration and pain management and reassessment.  Repeat abdominal exam soft benign.  Patient remains Heema dynamically stable and appropriate for admission.   [PR]    Clinical Course User Index [PR] Willy Eddy, MD     As part of my medical decision making, I reviewed the following data within the electronic MEDICAL RECORD NUMBER Nursing notes reviewed and incorporated, Labs reviewed, notes from prior ED visits.   ____________________________________________   FINAL CLINICAL IMPRESSION(S) / ED DIAGNOSES  Final diagnoses:  RUQ pain  Acute pancreatitis without infection or necrosis, unspecified pancreatitis type      NEW MEDICATIONS STARTED DURING THIS VISIT:  New Prescriptions   No medications on file     Note:  This document was prepared using Dragon voice recognition software and may include unintentional dictation errors.    Willy Eddy, MD 03/26/18 [id number]

## 2018-03-26 NOTE — ED Notes (Signed)
Pt resting on stretcher with lights off to enhance rest. Pt reports pain has improved after IV fluids. Awaiting admission. Provided for safety and comfort and will continue to assess.

## 2018-03-26 NOTE — ED Notes (Signed)
Returned from CT.

## 2018-03-26 NOTE — H&P (Signed)
[facility] - [place] at [facility]   PATIENT NAME: Ellen [name] Ellen [name]    MR#:  [id number]  DATE OF BIRTH:  07/11/1945  DATE OF ADMISSION:  03/26/2018  PRIMARY CARE PHYSICIAN: Rayetta HumphreyGeorge, Sionne A, MD   REQUESTING/REFERRING PHYSICIAN: Dr. Willy EddyPatrick Robinson  CHIEF COMPLAINT: Abdominal pain   Chief Complaint  Patient presents with  . Abdominal Pain    HISTORY OF PRESENT ILLNESS:  Ellen [name] Ellen [name]  is a 72 y.o. female with a known history of essential hypertension, hyperlipidemia comes in because of epigastric abdominal pain radiating to the back since yesterday, associated with nausea, vomiting.  Patient has epigastric pain 7 out of 10 in severity associated nausea, vomiting since yesterday.  No diarrhea, no fever.  Patient found to have acute pancreatitis, lipase 2562, elevated AST, ALT up to 404, 402 respectively.  CT of abdomen showed cholelithiasis, pancreatic inflammation.  Received half liter of normal saline, Phenergan.  She did not get any pain medicine and she says that her aminal pain is minimal now. PAST MEDICAL HISTORY:   Past Medical History:  Diagnosis Date  . Allergic rhinitis   . Arthritis   . Hyperlipemia   . Obesity   . Varicella     PAST SURGICAL HISTOIRY:   Past Surgical History:  Procedure Laterality Date  . COLONOSCOPY    . COLONOSCOPY WITH PROPOFOL N/A 02/20/2017   Procedure: COLONOSCOPY WITH PROPOFOL;  Surgeon: Christena DeemSkulskie, Martin U, MD;  [facility];  Service: Endoscopy;  Laterality: N/A;  . EYE SURGERY    . JOINT REPLACEMENT Bilateral 08/11/2013,10/27/2013  . TUBAL LIGATION      SOCIAL HISTORY:   Social History   Tobacco Use  . Smoking status: Never Smoker  . Smokeless tobacco: Never Used  Substance Use Topics  . Alcohol use: No    Frequency: Never    FAMILY HISTORY:   Family History  Problem Relation Age of Onset  . Breast cancer Sister 5455    DRUG ALLERGIES:  No Known Allergies  REVIEW OF SYSTEMS:   CONSTITUTIONAL: No fever, fatigue or weakness.  EYES: No blurred or double vision.  EARS, NOSE, AND THROAT: No tinnitus or ear pain.  RESPIRATORY: No cough, shortness of breath, wheezing or hemoptysis.  CARDIOVASCULAR: No chest pain, orthopnea, edema.  GASTROINTESTINAL: epigastric pain, nausea, vomiting since yesterday. GENITOURINARY: No dysuria, hematuria.  ENDOCRINE: No polyuria, nocturia,  HEMATOLOGY: No anemia, easy bruising or bleeding SKIN: No rash or lesion. MUSCULOSKELETAL: No joint pain or arthritis.   NEUROLOGIC: No tingling, numbness, weakness.  PSYCHIATRY: No anxiety or depression.   MEDICATIONS AT HOME:   Prior to Admission medications   Medication Sig Start Date End Date Taking? Authorizing Provider  acetaminophen (TYLENOL) 500 MG tablet Take 500-1,000 mg by mouth every 6 (six) hours as needed for mild pain or moderate pain.    Yes [provider]  aspirin EC 81 MG tablet Take 81 mg daily by mouth.   Yes [provider]  cholecalciferol (VITAMIN D) 1000 units tablet Take 5,000 Units by mouth daily.    Yes [provider]  enalapril (VASOTEC) 10 MG tablet Take 10 mg daily by mouth.   Yes [provider]  fluticasone (FLONASE) 50 MCG/ACT nasal spray Place 2 sprays into both nostrils daily as needed for allergies or rhinitis.    Yes [provider]  hydrochlorothiazide (HYDRODIURIL) 12.5 MG tablet Take 12.5 mg daily by mouth.   Yes [provider]  ibuprofen (ADVIL,MOTRIN) 200 MG tablet  Take 200-400 mg by mouth every 6 (six) hours as needed for mild pain or moderate pain.    Yes [provider]  simvastatin (ZOCOR) 10 MG tablet Take 10 mg daily by mouth.   Yes [provider]      VITAL SIGNS:  Blood pressure 127/70, pulse 73, temperature 97.9 F (36.6 C), temperature source Oral, resp. rate 18, height 5\' 1"  (1.549 m), weight 89.4 kg, SpO2 98 %.  PHYSICAL EXAMINATION:  GENERAL:  72 y.o.-year-old  patient lying in the bed with no acute distress.  EYES: Pupils equal, round, reactive to light . No scleral icterus. Extraocular muscles intact.  HEENT: Head atraumatic, normocephalic. Oropharynx and nasopharynx clear.  NECK:  Supple, no jugular venous distention. No thyroid enlargement, no tenderness.  LUNGS: Normal breath sounds bilaterally, no wheezing, rales,rhonchi or crepitation. No use of accessory muscles of respiration.  CARDIOVASCULAR: S1, S2 normal. No murmurs, rubs, or gallops.  ABDOMEN: Slight epigastric tenderness present, no rebound tenderness.  Bowel sounds present. EXTREMITIES: No pedal edema, cyanosis, or clubbing.  NEUROLOGIC: Cranial nerves II through XII are intact. Muscle strength 5/5 in all extremities. Sensation intact. Gait not checked.  PSYCHIATRIC: The patient is alert and oriented x 3.  SKIN: No obvious rash, lesion, or ulcer.   LABORATORY PANEL:   CBC Recent Labs  Lab 03/26/18 1438  WBC 6.9  HGB 13.9  HCT 41.8  PLT 294   ------------------------------------------------------------------------------------------------------------------  Chemistries  Recent Labs  Lab 03/26/18 1438  NA 138  K 4.3  CL 104  CO2 24  GLUCOSE 128*  BUN 17  CREATININE 0.65  CALCIUM 9.5  AST 404*  ALT 402*  ALKPHOS 126  BILITOT 6.7*   ------------------------------------------------------------------------------------------------------------------  Cardiac Enzymes No results for input(s): TROPONINI in the last 168 hours. ------------------------------------------------------------------------------------------------------------------  RADIOLOGY:  Ct Abdomen Pelvis W Contrast  Result Date: 03/26/2018 CLINICAL DATA:  Abdominal pain EXAM: CT ABDOMEN AND PELVIS WITH CONTRAST TECHNIQUE: Multidetector CT imaging of the abdomen and pelvis was performed using the standard protocol following bolus administration of intravenous contrast. CONTRAST:  ISOVUE-300  IOPAMIDOL (ISOVUE-300) INJECTION 61% COMPARISON:  Ultrasound from earlier in the same day. FINDINGS: Lower chest: No acute abnormality. Hepatobiliary: Liver is diffusely decreased in attenuation consistent with fatty infiltration. Cholelithiasis is noted similar to that seen on prior ultrasound examination. Pancreas: Unremarkable. No pancreatic ductal dilatation or surrounding inflammatory changes. Spleen: Scattered calcified granulomas are noted. No splenic mass is seen. Adrenals/Urinary Tract: The odontoid is within normal limits. The kidneys are well visualized bilaterally. No renal calculi or obstructive changes are noted. Bladder is partially distended. Stomach/Bowel: Diverticular change of the colon is noted. No evidence of diverticulitis is seen. The appendix is within normal limits. No small bowel abnormality is seen. The stomach is unremarkable with the exception of a small sliding-type hiatal hernia. Vascular/Lymphatic: Aortic atherosclerosis. No enlarged abdominal or pelvic lymph nodes. Reproductive: Uterus and bilateral adnexa are unremarkable. Other: No abdominal wall hernia or abnormality. No abdominopelvic ascites. Musculoskeletal: Degenerative changes of the lumbar spine are noted. 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 IMPRESSION: Cholelithiasis without complicating factors. Diverticulosis without diverticulitis. Chronic changes as described above. Electronically Signed   By: Alcide Clever M.D.   On: 03/26/2018 18:03   US Abdomen Limited Ruq  Result Date: 03/26/2018 CLINICAL DATA:  Upper abdominal pain EXAM: ULTRASOUND ABDOMEN LIMITED RIGHT UPPER QUADRANT COMPARISON:  None. FINDINGS: Gallbladder: Within the gallbladder, there are echogenic  foci which shadow and move slightly consistent with cholelithiasis. Largest gallstone measures 1.0 cm in length. There is no appreciable gallbladder wall thickening or pericholecystic fluid. No  sonographic Murphy sign noted by sonographer. Common bile duct: Diameter: 4 mm. No intrahepatic or extrahepatic biliary duct dilatation. Liver: No focal lesion identified. Liver echogenicity is increased diffusely. Portal vein is patent on color Doppler imaging with normal direction of blood flow towards the liver. IMPRESSION: 1. Cholelithiasis. No appreciable gallbladder wall thickening or pericholecystic fluid. 2. Increase in liver echogenicity, a finding felt to be indicative of hepatic steatosis. While no focal liver lesions are evident, it must be cautioned that the sensitivity of ultrasound for detection of focal liver lesions is diminished in this circumstance. Electronically Signed   By: Bretta Bang III M.D.   On: 03/26/2018 17:18    EKG:   Orders placed or performed during the hospital encounter of 03/26/18  . EKG 12-Lead  . EKG 12-Lead  . ED EKG  . ED EKG    IMPRESSION AND PLAN:   72 year old female patient with history of hypertension stated with nausea, vomiting found to have acute pancreatitis.  1.  Acute severe pancreatitis [place] might have possible CBD stone: Continue n.p.o., IV hydration, check lipase tomorrow, obtain GI consult, patient CT abdomen concerning for gallstones, pancreas inflammation but negative for pancreatic duct dilatation.  Patient may get MRCP.  Hold statins, continue n.p.o., IV fluids, empiric antibiotics. 2.  Essential hypertension: Controlled, concern for developing dehydration with acute pancreatitis.    All the records are reviewed and case discussed with ED provider. Management plans discussed with the patient, family and they are in agreement.  CODE STATUS: Full code  TOTAL TIME TAKING CARE OF THIS PATIENT: 55 minutes.    Katha Hamming M.D on 03/26/2018 at 8:07 PM  Between 7am to 6pm - Pager - [phone]  After 6pm go to [email or web address] - password [email or web address] [facility]  [place] [facility]  Office  [phone]  CC: Primary care  physician; Rayetta Humphrey, MD  Note: This dictation was prepared with Dragon dictation along with smaller phrase technology. Any transcriptional errors that result from this process are unintentional.

## 2018-03-26 NOTE — ED Notes (Signed)
Patient transported to CT 

## 2018-03-27 ENCOUNTER — Inpatient Hospital Stay: Payer: Medicare HMO

## 2018-03-27 DIAGNOSIS — K851 Biliary acute pancreatitis without necrosis or infection: Principal | ICD-10-CM

## 2018-03-27 LAB — COMPREHENSIVE METABOLIC PANEL
ALT: 269 U/L — ABNORMAL HIGH (ref 0–44)
AST: 204 U/L — ABNORMAL HIGH (ref 15–41)
Albumin: 3.4 g/dL — ABNORMAL LOW (ref 3.5–5.0)
Alkaline Phosphatase: 114 U/L (ref 38–126)
Anion gap: 10 (ref 5–15)
BUN: 14 mg/dL (ref 8–23)
CO2: 18 mmol/L — ABNORMAL LOW (ref 22–32)
Calcium: 8.7 mg/dL — ABNORMAL LOW (ref 8.9–10.3)
Chloride: 112 mmol/L — ABNORMAL HIGH (ref 98–111)
Creatinine, Ser: 0.62 mg/dL (ref 0.44–1.00)
GFR calc Af Amer: >60 mL/min (ref >60)
GLUCOSE: 71 mg/dL (ref 70–99)
Potassium: 3.6 mmol/L (ref 3.5–5.1)
Sodium: 140 mmol/L (ref 135–145)
Total Bilirubin: 2.2 mg/dL — ABNORMAL HIGH (ref 0.3–1.2)
Total Protein: 6.3 g/dL — ABNORMAL LOW (ref 6.5–8.1)

## 2018-03-27 LAB — LIPID PANEL
Cholesterol: 144 mg/dL (ref 0–200)
HDL: 30 mg/dL — ABNORMAL LOW (ref >40)
LDL Cholesterol: 74 mg/dL (ref 0–99)
Total CHOL/HDL Ratio: 4.8 RATIO
Triglycerides: 202 mg/dL — ABNORMAL HIGH (ref <150)
VLDL: 40 mg/dL (ref 0–40)

## 2018-03-27 LAB — CBC
HCT: 37.2 % (ref 36.0–46.0)
Hemoglobin: 12 g/dL (ref 12.0–15.0)
MCH: 30 pg (ref 26.0–34.0)
MCHC: 32.3 g/dL (ref 30.0–36.0)
MCV: 93 fL (ref 80.0–100.0)
Platelets: 245 10*3/uL (ref 150–400)
RBC: 4 MIL/uL (ref 3.87–5.11)
RDW: 12.4 % (ref 11.5–15.5)
WBC: 7.3 10*3/uL (ref 4.0–10.5)
nRBC: 0 % (ref 0.0–0.2)

## 2018-03-27 LAB — GLUCOSE, CAPILLARY
Glucose-Capillary: 64 mg/dL — ABNORMAL LOW (ref 70–99)
Glucose-Capillary: 100 mg/dL — ABNORMAL HIGH (ref 70–99)
Glucose-Capillary: 61 mg/dL — ABNORMAL LOW (ref 70–99)
Glucose-Capillary: 97 mg/dL (ref 70–99)

## 2018-03-27 LAB — LIPASE, BLOOD: Lipase: 145 U/L — ABNORMAL HIGH (ref 11–51)

## 2018-03-27 MED ORDER — KETOROLAC TROMETHAMINE 15 MG/ML IJ SOLN
15.0000 mg | Freq: Once | INTRAMUSCULAR | Status: AC
  Administered 2018-03-27: 15 mg via INTRAVENOUS
  Filled 2018-03-27 (×2): qty 1

## 2018-03-27 MED ORDER — DEXTROSE 50 % IV SOLN
12.5000 g | INTRAVENOUS | Status: AC
  Administered 2018-03-27: 12.5 g via INTRAVENOUS
  Filled 2018-03-27: qty 50

## 2018-03-27 MED ORDER — DEXTROSE 50 % IV SOLN
25.0000 mL | Freq: Once | INTRAVENOUS | Status: AC
  Administered 2018-03-27: 25 mL via INTRAVENOUS
  Filled 2018-03-27: qty 50

## 2018-03-27 MED ORDER — SODIUM CHLORIDE 0.9 % IV SOLN
2.0000 g | INTRAVENOUS | Status: DC
  Administered 2018-03-27 – 2018-03-28 (×2): 2 g via INTRAVENOUS
  Filled 2018-03-27: qty 2
  Filled 2018-03-27: qty 20
  Filled 2018-03-27: qty 2

## 2018-03-27 MED ORDER — LACTATED RINGERS IV SOLN
INTRAVENOUS | Status: DC
  Administered 2018-03-27 – 2018-03-28 (×3): via INTRAVENOUS

## 2018-03-27 MED ORDER — LORAZEPAM 2 MG/ML IJ SOLN
0.5000 mg | Freq: Once | INTRAMUSCULAR | Status: AC
  Administered 2018-03-27: 0.5 mg via INTRAVENOUS
  Filled 2018-03-27: qty 1

## 2018-03-27 NOTE — Progress Notes (Signed)
Called MRI to find out status of patient's MRCP as it has not yet been completed- technician states that they will likely  be able to complete patient's procedure around 12am tonight.

## 2018-03-27 NOTE — Progress Notes (Signed)
[facility] - [place] at [facility]   PATIENT NAME: Ellen [name]    MR#:  [id number]  DATE OF BIRTH:  10/22/1945  SUBJECTIVE:  Came in with epigastric pain. Feeling much better today no vomiting  REVIEW OF SYSTEMS:   Review of Systems  Constitutional: Negative for chills, fever and weight loss.  HENT: Negative for ear discharge, ear pain and nosebleeds.   Eyes: Negative for blurred vision, pain and discharge.  Respiratory: Negative for sputum production, shortness of breath, wheezing and stridor.   Cardiovascular: Negative for chest pain, palpitations, orthopnea and PND.  Gastrointestinal: Negative for abdominal pain, diarrhea, nausea and vomiting.  Genitourinary: Negative for frequency and urgency.  Musculoskeletal: Negative for back pain and joint pain.  Neurological: Positive for weakness. Negative for sensory change, speech change and focal weakness.  Psychiatric/Behavioral: Negative for depression and hallucinations. The patient is not nervous/anxious.    Tolerating Diet:npo Tolerating PT:   DRUG ALLERGIES:  No Known Allergies  VITALS:  Blood pressure 132/60, pulse 72, temperature 98.5 F (36.9 C), temperature source Oral, resp. rate 14, height 5\' 1"  (1.549 m), weight 89.7 kg, SpO2 98 %.  PHYSICAL EXAMINATION:   Physical Exam  GENERAL:  72 y.o.-year-old patient lying in the bed with no acute distress. obese EYES: Pupils equal, round, reactive to light and accommodation. No scleral icterus. Extraocular muscles intact.  HEENT: Head atraumatic, normocephalic. Oropharynx and nasopharynx clear.  NECK:  Supple, no jugular venous distention. No thyroid enlargement, no tenderness.  LUNGS: Normal breath sounds bilaterally, no wheezing, rales, rhonchi. No use of accessory muscles of respiration.  CARDIOVASCULAR: S1, S2 normal. No murmurs, rubs, or gallops.  ABDOMEN: Soft, diffusely tender, nondistended. Bowel sounds present. No organomegaly or mass.   EXTREMITIES: No cyanosis, clubbing or edema b/l.    NEUROLOGIC: Cranial nerves II through XII are intact. No focal Motor or sensory deficits b/l.   PSYCHIATRIC:  patient is alert and oriented x 3.  SKIN: No obvious rash, lesion, or ulcer.   LABORATORY PANEL:  CBC Recent Labs  Lab 03/27/18 0522  WBC 7.3  HGB 12.0  HCT 37.2  PLT 245    Chemistries  Recent Labs  Lab 03/27/18 0522  NA 140  K 3.6  CL 112*  CO2 18*  GLUCOSE 71  BUN 14  CREATININE 0.62  CALCIUM 8.7*  AST 204*  ALT 269*  ALKPHOS 114  BILITOT 2.2*   Cardiac Enzymes No results for input(s): TROPONINI in the last 168 hours. RADIOLOGY:  Ct Abdomen Pelvis W Contrast  Result Date: 03/26/2018 CLINICAL DATA:  Abdominal pain EXAM: CT ABDOMEN AND PELVIS WITH CONTRAST TECHNIQUE: Multidetector CT imaging of the abdomen and pelvis was performed using the standard protocol following bolus administration of intravenous contrast. CONTRAST:  100mL ISOVUE-300 IOPAMIDOL (ISOVUE-300) INJECTION 61% COMPARISON:  Ultrasound from earlier in the same day. FINDINGS: Lower chest: No acute abnormality. Hepatobiliary: Liver is diffusely decreased in attenuation consistent with fatty infiltration. Cholelithiasis is noted similar to that seen on prior ultrasound examination. Pancreas: Unremarkable. No pancreatic ductal dilatation or surrounding inflammatory changes. Spleen: Scattered calcified granulomas are noted. No splenic mass is seen. Adrenals/Urinary Tract: The odontoid is within normal limits. The kidneys are well visualized bilaterally. No renal calculi or obstructive changes are noted. Bladder is partially distended. Stomach/Bowel: Diverticular change of the colon is noted. No evidence of diverticulitis is seen. The appendix is within normal limits. No small bowel abnormality is seen. The stomach is unremarkable with the exception of a  small sliding-type hiatal hernia. Vascular/Lymphatic: Aortic atherosclerosis. No enlarged abdominal or  pelvic lymph nodes. Reproductive: Uterus and bilateral adnexa are unremarkable. Other: No abdominal wall hernia or abnormality. No abdominopelvic ascites. Musculoskeletal: Degenerative changes of the lumbar spine are noted. 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 IMPRESSION: Cholelithiasis without complicating factors. Diverticulosis without diverticulitis. Chronic changes as described above. Electronically Signed   By: Alcide Clever M.D.   On: 03/26/2018 18:03   US Abdomen Limited Ruq  Result Date: 03/26/2018 CLINICAL DATA:  Upper abdominal pain EXAM: ULTRASOUND ABDOMEN LIMITED RIGHT UPPER QUADRANT COMPARISON:  None. FINDINGS: Gallbladder: Within the gallbladder, there are echogenic foci which shadow and move slightly consistent with cholelithiasis. Largest gallstone measures 1.0 cm in length. There is no appreciable gallbladder wall thickening or pericholecystic fluid. No sonographic Murphy sign noted by sonographer. Common bile duct: Diameter: 4 mm. No intrahepatic or extrahepatic biliary duct dilatation. Liver: No focal lesion identified. Liver echogenicity is increased diffusely. Portal vein is patent on color Doppler imaging with normal direction of blood flow towards the liver. IMPRESSION: 1. Cholelithiasis. No appreciable gallbladder wall thickening or pericholecystic fluid. 2. Increase in liver echogenicity, a finding felt to be indicative of hepatic steatosis. While no focal liver lesions are evident, it must be cautioned that the sensitivity of ultrasound for detection of focal liver lesions is diminished in this circumstance. Electronically Signed   By: Bretta Bang III M.D.   On: 03/26/2018 17:18   ASSESSMENT AND PLAN:   Ellen [name]  is a 72 y.o. female with a known history of essential hypertension, hyperlipidemia comes in because of epigastric abdominal pain radiating to the back since yesterday, associated with nausea,  vomiting.  Patient has epigastric pain 7 out of 10 in severity associated nausea, vomiting since yesterday  1. Acute severe pancreatitis suspected due to Gall stones -NPO after midnight -MRCP ordered--spoke with MRI tech they will do it tonite -total bili 6.6--2.2 -lipase 2500---145  -Appreciated surgical input -GI consult with Dr Norma Fredrickson -TG 202  2.  Essential hypertension: Controlled  3. DVT prophylaxis lovenox SQ  Case discussed with Care Management/Social Worker. Management plans discussed with the patient, family and they are in agreement.  CODE STATUS: FULL  DVT Prophylaxis: lovenox  TOTAL TIME TAKING CARE OF THIS PATIENT: *40 minutes.  >50% time spent on counselling and coordination of care  POSSIBLE D/C IN *2-3* DAYS, DEPENDING ON CLINICAL CONDITION.  Note: This dictation was prepared with Dragon dictation along with smaller phrase technology. Any transcriptional errors that result from this process are unintentional.  Enedina Finner M.D on 03/27/2018 at 7:56 PM  Between 7am to 6pm - Pager - [phone]  After 6pm go to [email or web address] - password [email or web address]  [facility] [place] [facility]  Office  [phone]  CC: Primary care physician; Rayetta Humphrey, MDPatient ID: Dickey Gave, female   DOB: 27-Jun-1945, 72 y.o.   MRN: [id number]

## 2018-03-27 NOTE — Progress Notes (Signed)
Hypoglycemic Event  CBG: 64  Treatment: 1/2 amp D50 Symptoms: none Follow-up CBG: CBG Result:100  Possible Reasons for Event: NPO    Ellen MareElizabeth B [place] [name]

## 2018-03-27 NOTE — Consult Note (Addendum)
SURGICAL CONSULTATION NOTE (initial) - cpt: [phone]: [id number]  Patient seen and examined as described below with surgical PA-C, Gillermina PhyZachary Shulz.  Assessment/Plan: (ICD-10's: K85.1) 72 y.o. female with improving hyperbilirubinemia, hyperlipasemia, and abdominal pain attributable to gallstone pancreatitis following likely resolved transient choledocholithiasis without sonographic or radiographic evidence of cholecystitis, complicated by comorbidities including obesity (BMI >37), HLD, and osteoarthritis.   - pain control as needed (minimize narcotics as able)  - check/trend morning CMP (including LFT's) and lipase  - continue to monitor abdominal exam and bowel function  - follow up already ordered MRCP, GI consultation appreciated (discussed with GI PA)  - NPO after midnight, though okay with clear liquids diet until midnight following MRCP  - all risks, benefits, and alternatives to cholecystectomy with intra-operative cholangiogram were discussed with the patient, all of her questions were answered to her expressed satisfaction, patient expresses she wishes to proceed, and informed consent was obtained.  - will tentatively plan for laparoscopic cholecystectomy tomorrow morning pending OR availability and MRCP  - risk assessment, optimization, and medical management of comorbidities per medical team  - DVT prophylaxis, ambulation encouraged  I have personally reviewed the patient's chart, evaluated/examined the patient, proposed the recommended management, and discussed these recommendations with the patient to her expressed satisfaction as well as with patient's RN and medical physician.  Thank you for the opportunity to participate in this patient's care.  -- Scherrie GerlachJason E. Earlene Plateravis, MD, RPVI [address]: [facility] General Surgery - Partnering for exceptional care. Office: [phone]    SURGICAL CONSULTATION NOTE (initial) - cpt: [phone]  HISTORY OF PRESENT ILLNESS (HPI):  72 y.o. female  [facility] ED yesterday for evaluation of abdominal pain. Patient reports her pain began ~9 am 1 day earlier (Monday) after breakfast and was focused primarily at her epigastric region, which she described as a "band-like" pain radiating through to her back. She also reports nausea and decreased PO intake prior to presentation. This afternoon, she describes her pain has nearly resolved and only notices intermittent mild 2/10 pain without nausea. She has remained NPO. She recalls a similar episode of abdominal pain about 1 year ago, but says it was less severe and quickly resolved. Patient otherwise denies fever/chills, emesis, changes in bowel function, or urinary symptoms and likewise denies any CP or SOB with walking >1 - 2 blocks or up/down a flight of steps without stopping.  Surgery is consulted by hospitalist physician Dr. Enedina FinnerSona Patel, MD in this context for evaluation and management of gallstone pnacreatitis.  PAST MEDICAL HISTORY (PMH):  Past Medical History:  Diagnosis Date  . Allergic rhinitis   . Arthritis   . Hyperlipemia   . Obesity   . Varicella     PAST SURGICAL HISTORY (PSH):  Past Surgical History:  Procedure Laterality Date  . COLONOSCOPY    . COLONOSCOPY WITH PROPOFOL N/A 02/20/2017   Procedure: COLONOSCOPY WITH PROPOFOL;  Surgeon: Christena DeemSkulskie, Martin U, MD;  [facility];  Service: Endoscopy;  Laterality: N/A;  . EYE SURGERY    . JOINT REPLACEMENT Bilateral 08/11/2013,10/27/2013  . TUBAL LIGATION      MEDICATIONS:  Prior to Admission medications   Medication Sig Start Date End Date Taking? Authorizing Provider  acetaminophen (TYLENOL) 500 MG tablet Take 500-1,000 mg by mouth every 6 (six) hours as needed for mild pain or moderate pain.    Yes [provider]  aspirin EC 81 MG tablet Take 81 mg daily by mouth.   Yes [provider]  cholecalciferol (VITAMIN D) 1000 units tablet Take 5,000 Units by mouth daily.    Yes [provider]  enalapril (VASOTEC) 10 MG tablet Take 10 mg daily by mouth.   Yes [provider]  fluticasone (FLONASE) 50 MCG/ACT nasal spray Place 2 sprays into both nostrils daily as needed for allergies or rhinitis.    Yes [provider]  hydrochlorothiazide (HYDRODIURIL) 12.5 MG tablet Take 12.5 mg daily by mouth.   Yes [provider]  ibuprofen (ADVIL,MOTRIN) 200 MG tablet Take 200-400 mg by mouth every 6 (six) hours as needed for mild pain or moderate pain.    Yes [provider]  simvastatin (ZOCOR) 10 MG tablet Take 10 mg daily by mouth.   Yes [provider]   ALLERGIES:  No Known Allergies   SOCIAL HISTORY:  Social History   Socioeconomic History  . Marital status: Married    Spouse name: Not on file  . Number of children: Not on file  . Years of education: Not on file  . Highest education level: Not on file  Occupational History  . Not on file  Social Needs  . Financial resource strain: Not on file  . Food insecurity:    Worry: Not on file    Inability: Not on file  . Transportation needs:    Medical: Not on file    Non-medical: Not on file  Tobacco Use  . Smoking status: Never Smoker  . Smokeless tobacco: Never Used  Substance and Sexual Activity  . Alcohol use: No    Frequency: Never  . Drug use: No  . Sexual activity: Not on file  Lifestyle  . Physical activity:    Days per week: Not on file    Minutes per session: Not on file  . Stress: Not on file  Relationships  . Social connections:    Talks on phone: Not on file    Gets together: Not on file    Attends religious service: Not on file    Active member of club or organization: Not on file    Attends meetings of clubs or organizations: Not on file    Relationship status: Not on file  . Intimate partner violence:    Fear of current or ex partner: Not on file    Emotionally abused: Not on file    Physically abused: Not on file    Forced sexual activity: Not on  file  Other Topics Concern  . Not on file  Social History Narrative  . Not on file    The patient currently resides (home / rehab facility / nursing home): Home The patient normally is (ambulatory / bedbound): Ambulatory   FAMILY HISTORY:  Family History  Problem Relation Age of Onset  . Breast cancer Sister 71    REVIEW OF SYSTEMS:  Constitutional: denies weight loss, fever, chills, or sweats. + Decreased Appetite Eyes: denies any other vision changes, history of eye injury  ENT: denies sore throat, hearing problems  Respiratory: denies shortness of breath, wheezing  Cardiovascular: denies chest pain, palpitations  Gastrointestinal: abdominal pain, N/V, and bowel function as per HPI Genitourinary: denies burning with urination or urinary frequency Musculoskeletal: denies any other joint pains or cramps  Skin: denies any other rashes or skin discolorations  Neurological: denies any other headache, dizziness, weakness  Psychiatric: denies any other depression, anxiety   All other review of systems were negative   VITAL SIGNS:  Temp:  [97.9 F (36.6 C)-98.5 F (  36.9 C)] 98.5 F (36.9 C) (12/11 1326) Pulse Rate:  [72-82] 72 (12/11 1326) Resp:  [14-20] 14 (12/11 1326) BP: (121-141)/(58-70) 132/60 (12/11 1326) SpO2:  [96 %-100 %] 98 % (12/11 1326) Weight:  [89.1 kg-89.7 kg] 89.7 kg (12/11 0500)     Height: 5\' 1"  (154.9 cm) Weight: 89.7 kg BMI (Calculated): 37.38   INTAKE/OUTPUT:  This shift: Total I/O In: 958.9 [I.V.:958.9] Out: -   Last 2 shifts: @IOLAST2SHIFTS @   PHYSICAL EXAM:  Constitutional:  -- Normal body habitus  -- Awake, alert, and oriented x3, no apparent distress Eyes:  -- Pupils equally round and reactive to light  -- No scleral icterus, B/L no occular discharge Ear, nose, throat: -- Neck is FROM WNL Pulmonary:  -- No wheezes or rhales -- Equal breath sounds bilaterally -- Breathing non-labored at rest Cardiovascular:  -- S1, S2 present  -- No  pericardial rubs  Gastrointestinal:  -- Abdomen obese, soft, nontender, non-distended, no guarding or rebound tenderness -- Negative Murphy's Sign -- No abdominal masses appreciated, pulsatile or otherwise  Musculoskeletal and Integumentary:  -- Wounds or skin discoloration: None appreciated -- Extremities: B/L UE and LE FROM, hands and feet warm, no edema  Neurologic:  -- Motor function: Intact and symmetric -- Sensation: Intact and symmetric Psychiatric:  -- Mood and affect WNL  Labs:  CBC Latest Ref Rng & Units 03/27/2018 03/26/2018 10/29/2013  WBC 4.0 - 10.5 K/uL 7.3 6.9 -  Hemoglobin 12.0 - 15.0 g/dL 16.1 09.6 0.4(V)  Hematocrit 36.0 - 46.0 % 37.2 41.8 -  Platelets 150 - 400 K/uL 245 294 216   CMP Latest Ref Rng & Units 03/27/2018 03/26/2018 10/29/2013  Glucose 70 - 99 mg/dL 71 409(W) 119(J)  BUN 8 - 23 mg/dL 14 17 5(L)  Creatinine 0.44 - 1.00 mg/dL 4.78 2.95 6.21  Sodium 135 - 145 mmol/L 140 138 138  Potassium 3.5 - 5.1 mmol/L 3.6 4.3 3.8  Chloride 98 - 111 mmol/L 112(H) 104 106  CO2 22 - 32 mmol/L 18(L) 24 27  Calcium 8.9 - 10.3 mg/dL 3.0(Q) 9.5 8.5  Total Protein 6.5 - 8.1 g/dL 6.3(L) 7.5 -  Total Bilirubin 0.3 - 1.2 mg/dL 2.2(H) 6.7(H) -  Alkaline Phos 38 - 126 U/L 114 126 -  AST 15 - 41 U/L 204(H) 404(H) -  ALT 0 - 44 U/L 269(H) 402(H) -   Imaging studies:  CT Abdomen/Pelvis (03/26/2018):  Cholelithiasis without complicating factors. Diverticulosis without diverticulitis. Chronic changes as described above.  Limited RUQ Abdominal Ultrasound (03/26/2018): 1. Cholelithiasis. No appreciable gallbladder wall thickening or pericholecystic fluid. 2. Increase in liver echogenicity, a finding felt to be indicative of hepatic steatosis. While no focal liver lesions are evident, it must be cautioned that the sensitivity of ultrasound for detection of focal liver lesions is diminished in this circumstance.  Assessment/Plan: (ICD-10's: K85.1) 72 y.o. female with improving  hyperbilirubinemia and improving lipase levels likely secondary to gallstone pancreatitis due to passed CBD gallstone with cholelithiasis without cholecystitis visualized sonographically, complicated by pertinent comorbidities including HLD, arthritis, and obesity.   - NPO + IVF   - Await results of MRCP. Consider GI consult if retained CBD stones appreciated on imaging.   - Will tentatively plan for laparoscopic cholecystectomy tomorrow morning with Dr Earlene Plater, MD pending OR and anesthesia availability. Discussed details of this procedure with her including risks, benefits, and alternative. She voiced understanding and was in agreement.   - Monitor abdominal examination  - Trend Lipase, Bilirubin, LFTs  -  Pain control as needed  - Medical management of comorbidities  - Mobilize as tolerates   - DVT prophylaxis  All of the above findings and recommendations were discussed with the patient and his/her family, and all of patient's and her family's questions were answered to their expressed satisfaction.  Thank you for the opportunity to participate in this patient's care.   -- Lynden Oxford, PA-C  [facility] 03/27/2018, 3:07 PM [phone] M-F: 7am - 4pm

## 2018-03-27 NOTE — Consult Note (Signed)
GI Inpatient Consult Note  Reason for Consult: Gallstone pancreatitis    Attending Requesting Consult: Dr. Allena Katz, MD  History of Present Illness: Ellen [name] is a 72 y.o. female seen for evaluation of gallstone pancreatitis at the request of Dr. Allena Katz, MD. Pt has a PMH of hyperlipidemia, arthritis, and obesity. She presented to the [facility] ED yesterday for evaluation of epigastric abd pain radiating to her back with associated nausea that started on Monday morning. In the ED, she was found to have elevated LFTs, hyperbilirubinemia, and elevated lipase as well as US showing cholelithiasis. She was seen by surgery this afternoon who are planning on doing a laparoscopic cholecystectomy tomorrow pending MRCP. Lipase and bilirubin are improving.  Pt seen and examined resting comfortably in hospital bed this afternoon. She reports her pain level has improved tremendously since yesterday She rates epigastric pain as 1/10 in severity without any nausea or vomiting. She has remained NPO. She denies any fever, chest pain, SOB, dysphagia, GERD exacerbations, or bowel habit changes. Lipase has decreased today to 145 (from 2,562 yesterday) and bilirubin is 2.2 (from 6.7 yesterday).   Past Medical History:  Past Medical History:  Diagnosis Date  . Allergic rhinitis   . Arthritis   . Hyperlipemia   . Obesity   . Varicella     Problem List: Patient Active Problem List   Diagnosis Date Noted  . Acute pancreatitis 03/26/2018    Past Surgical History: Past Surgical History:  Procedure Laterality Date  . COLONOSCOPY    . COLONOSCOPY WITH PROPOFOL N/A 02/20/2017   Procedure: COLONOSCOPY WITH PROPOFOL;  Surgeon: Christena Deem, MD;  Location: [facility] ENDOSCOPY;  Service: Endoscopy;  Laterality: N/A;  . EYE SURGERY    . JOINT REPLACEMENT Bilateral 08/11/2013,10/27/2013  . TUBAL LIGATION      Allergies: No Known Allergies  Home Medications: Medications Prior to Admission  Medication Sig Dispense Refill  Last Dose  . acetaminophen (TYLENOL) 500 MG tablet Take 500-1,000 mg by mouth every 6 (six) hours as needed for mild pain or moderate pain.    Unknown at PRN  . aspirin EC 81 MG tablet Take 81 mg daily by mouth.   03/24/2018 at 0800  . cholecalciferol (VITAMIN D) 1000 units tablet Take 5,000 Units by mouth daily.    03/24/2018 at 0800  . enalapril (VASOTEC) 10 MG tablet Take 10 mg daily by mouth.   03/24/2018 at 0800  . fluticasone (FLONASE) 50 MCG/ACT nasal spray Place 2 sprays into both nostrils daily as needed for allergies or rhinitis.    Unknown at PRN  . hydrochlorothiazide (HYDRODIURIL) 12.5 MG tablet Take 12.5 mg daily by mouth.   03/24/2018 at 0800  . ibuprofen (ADVIL,MOTRIN) 200 MG tablet Take 200-400 mg by mouth every 6 (six) hours as needed for mild pain or moderate pain.    Unknown at PRN  . simvastatin (ZOCOR) 10 MG tablet Take 10 mg daily by mouth.   03/24/2018 at 0800   Home medication reconciliation was completed with the patient.   Scheduled Inpatient Medications:   . enoxaparin (LOVENOX) injection  40 mg Subcutaneous Q24H  . LORazepam  0.5 mg Intravenous Once    Continuous Inpatient Infusions:   . sodium chloride 125 mL/hr at 03/27/18 1427    PRN Inpatient Medications:  bisacodyl, fluticasone, morphine injection, ondansetron **OR** ondansetron (ZOFRAN) IV, promethazine  Family History: family history includes Breast cancer (age of onset: 53) in her sister.  The patient's family history is negative for inflammatory  bowel disorders, GI malignancy, or solid organ transplantation.  Social History:   reports that she has never smoked. She has never used smokeless tobacco. She reports that she does not drink alcohol or use drugs. The patient denies ETOH, tobacco, or drug use.   Review of Systems: Constitutional: Weight is stable.  Eyes: No changes in vision. ENT: No oral lesions, sore throat.  GI: see HPI.  Heme/Lymph: No easy bruising.  CV: No chest pain.  GU: No  hematuria.  Integumentary: No rashes.  Neuro: No headaches.  Psych: No depression/anxiety.  Endocrine: No heat/cold intolerance.  Allergic/Immunologic: No urticaria.  Resp: No cough, SOB.  Musculoskeletal: No joint swelling.    Physical Examination: BP 132/60 (BP Location: Left Arm)   Pulse 72   Temp 98.5 F (36.9 C) (Oral)   Resp 14   Ht 5\' 1"  (1.549 m)   Wt 89.7 kg   SpO2 98%   BMI 37.37 kg/m  Gen: NAD, alert and oriented x 4 HEENT: PEERLA, EOMI, Neck: supple, no JVD or thyromegaly Chest: CTA bilaterally, no wheezes, crackles, or other adventitious sounds CV: RRR, no m/g/c/r Abd: soft, very minimal epigastric tenderness, ND, +BS in all four quadrants; no HSM, guarding, ridigity, or rebound tenderness Ext: no edema, well perfused with 2+ pulses, Skin: no rash or lesions noted Lymph: no LAD  Data: Lab Results  Component Value Date   WBC 7.3 03/27/2018   HGB 12.0 03/27/2018   HCT 37.2 03/27/2018   MCV 93.0 03/27/2018   PLT 245 03/27/2018   Recent Labs  Lab 03/26/18 1438 03/27/18 0522  HGB 13.9 12.0   Lab Results  Component Value Date   NA 140 03/27/2018   K 3.6 03/27/2018   CL 112 (H) 03/27/2018   CO2 18 (L) 03/27/2018   BUN 14 03/27/2018   CREATININE 0.62 03/27/2018   Lab Results  Component Value Date   ALT 269 (H) 03/27/2018   AST 204 (H) 03/27/2018   ALKPHOS 114 03/27/2018   BILITOT 2.2 (H) 03/27/2018   No results for input(s): APTT, INR, PTT in the last 168 hours. Assessment/Plan:  72 y/o Caucasian female with a PMH of obesity, arthritis, and hyperlipidemia admitted for gallstone pancreatitis  1. Gallstone pancreatitis: - Lipase and bilirubin are trending down. - Surgery has seen patient today and planning for lap chole tomorrow pending MRCP this afternoon - Remain NPO with IV fluid hydration - If MRCP shows stone, pt will need ERCP with Dr. Servando SnareWohl - If MRCP is negative, proceed with lap chole tomorrow  Further recs after MRCP   Thank you  for the consult. Please call with questions or concerns.  Gilda CreaseGranville M Fredrica Capano, PA-C [facility]  [phone]

## 2018-03-28 ENCOUNTER — Encounter: Payer: Self-pay | Admitting: *Deleted

## 2018-03-28 ENCOUNTER — Inpatient Hospital Stay: Payer: Medicare HMO

## 2018-03-28 ENCOUNTER — Inpatient Hospital Stay: Payer: Medicare HMO | Admitting: Anesthesiology

## 2018-03-28 ENCOUNTER — Encounter
Admission: EM | Disposition: A | Discharge status: Home / Self Care | Payer: Self-pay | Source: Home / Self Care | Attending: Internal Medicine

## 2018-03-28 DIAGNOSIS — K859 Acute pancreatitis without necrosis or infection, unspecified: Secondary | ICD-10-CM

## 2018-03-28 HISTORY — PX: CHOLECYSTECTOMY: SHX55

## 2018-03-28 LAB — COMPREHENSIVE METABOLIC PANEL
ALT: 161 U/L — ABNORMAL HIGH (ref 0–44)
AST: 82 U/L — ABNORMAL HIGH (ref 15–41)
Albumin: 3.3 g/dL — ABNORMAL LOW (ref 3.5–5.0)
Alkaline Phosphatase: 87 U/L (ref 38–126)
Anion gap: 8 (ref 5–15)
BUN: 12 mg/dL (ref 8–23)
CALCIUM: 8.5 mg/dL — AB (ref 8.9–10.3)
CO2: 19 mmol/L — ABNORMAL LOW (ref 22–32)
Chloride: 114 mmol/L — ABNORMAL HIGH (ref 98–111)
Creatinine, Ser: 0.62 mg/dL (ref 0.44–1.00)
GFR calc Af Amer: >60 mL/min (ref >60)
GFR calc non Af Amer: >60 mL/min (ref >60)
Glucose, Bld: 66 mg/dL — ABNORMAL LOW (ref 70–99)
Potassium: 4.1 mmol/L (ref 3.5–5.1)
SODIUM: 141 mmol/L (ref 135–145)
Total Bilirubin: 1.4 mg/dL — ABNORMAL HIGH (ref 0.3–1.2)
Total Protein: 5.9 g/dL — ABNORMAL LOW (ref 6.5–8.1)

## 2018-03-28 LAB — CBC
HEMATOCRIT: 35.3 % — AB (ref 36.0–46.0)
Hemoglobin: 11.3 g/dL — ABNORMAL LOW (ref 12.0–15.0)
MCH: 30.1 pg (ref 26.0–34.0)
MCHC: 32 g/dL (ref 30.0–36.0)
MCV: 94.1 fL (ref 80.0–100.0)
Platelets: 229 10*3/uL (ref 150–400)
RBC: 3.75 MIL/uL — ABNORMAL LOW (ref 3.87–5.11)
RDW: 12.5 % (ref 11.5–15.5)
WBC: 6 10*3/uL (ref 4.0–10.5)
nRBC: 0 % (ref 0.0–0.2)

## 2018-03-28 LAB — GLUCOSE, CAPILLARY
Glucose-Capillary: 63 mg/dL — ABNORMAL LOW (ref 70–99)
Glucose-Capillary: 74 mg/dL (ref 70–99)
Glucose-Capillary: 77 mg/dL (ref 70–99)

## 2018-03-28 LAB — LIPASE, BLOOD: Lipase: 61 U/L — ABNORMAL HIGH (ref 11–51)

## 2018-03-28 LAB — MRSA PCR SCREENING: MRSA by PCR: NEGATIVE

## 2018-03-28 SURGERY — LAPAROSCOPIC CHOLECYSTECTOMY WITH INTRAOPERATIVE CHOLANGIOGRAM
Anesthesia: General | Site: Abdomen

## 2018-03-28 MED ORDER — ROCURONIUM BROMIDE 100 MG/10ML IV SOLN
INTRAVENOUS | Status: DC | PRN
  Administered 2018-03-28: 50 mg via INTRAVENOUS

## 2018-03-28 MED ORDER — ONDANSETRON HCL 4 MG/2ML IJ SOLN: 4.0000 mg | Freq: Once | INTRAMUSCULAR | Status: DC | PRN

## 2018-03-28 MED ORDER — DEXTROSE 50 % IV SOLN
INTRAVENOUS | Status: AC
  Filled 2018-03-28: qty 50

## 2018-03-28 MED ORDER — FENTANYL CITRATE (PF) 100 MCG/2ML IJ SOLN
INTRAMUSCULAR | Status: AC
  Administered 2018-03-28: 25 ug via INTRAVENOUS
  Filled 2018-03-28: qty 2

## 2018-03-28 MED ORDER — CHLORHEXIDINE GLUCONATE CLOTH 2 % EX PADS: 6.0000 | MEDICATED_PAD | Freq: Once | CUTANEOUS | Status: DC

## 2018-03-28 MED ORDER — HYDROMORPHONE HCL 1 MG/ML IJ SOLN
INTRAMUSCULAR | Status: AC
  Filled 2018-03-28: qty 1

## 2018-03-28 MED ORDER — HYDROMORPHONE HCL 1 MG/ML IJ SOLN
INTRAMUSCULAR | Status: DC | PRN
  Administered 2018-03-28 (×2): 0.5 mg via INTRAVENOUS

## 2018-03-28 MED ORDER — DEXTROSE 50 % IV SOLN
12.5000 g | INTRAVENOUS | Status: AC
  Administered 2018-03-28: 12.5 g via INTRAVENOUS

## 2018-03-28 MED ORDER — FENTANYL CITRATE (PF) 100 MCG/2ML IJ SOLN
INTRAMUSCULAR | Status: AC
  Filled 2018-03-28: qty 2

## 2018-03-28 MED ORDER — ENOXAPARIN SODIUM 40 MG/0.4ML ~~LOC~~ SOLN: 40.0000 mg | SUBCUTANEOUS | Status: DC

## 2018-03-28 MED ORDER — SODIUM CHLORIDE (PF) 0.9 % IJ SOLN
INTRAMUSCULAR | Status: AC
  Filled 2018-03-28: qty 50

## 2018-03-28 MED ORDER — ROCURONIUM BROMIDE 50 MG/5ML IV SOLN
INTRAVENOUS | Status: AC
  Filled 2018-03-28: qty 1

## 2018-03-28 MED ORDER — ACETAMINOPHEN 500 MG PO TABS: 1000.0000 mg | ORAL_TABLET | ORAL | Status: DC

## 2018-03-28 MED ORDER — MORPHINE SULFATE (PF) 2 MG/ML IV SOLN: 2.0000 mg | INTRAVENOUS | Status: DC | PRN

## 2018-03-28 MED ORDER — LIDOCAINE HCL 1 % IJ SOLN
INTRAMUSCULAR | Status: DC | PRN
  Administered 2018-03-28: 20 mL

## 2018-03-28 MED ORDER — FENTANYL CITRATE (PF) 100 MCG/2ML IJ SOLN
25.0000 ug | INTRAMUSCULAR | Status: DC | PRN
  Administered 2018-03-28: 25 ug via INTRAVENOUS

## 2018-03-28 MED ORDER — DEXAMETHASONE SODIUM PHOSPHATE 10 MG/ML IJ SOLN
INTRAMUSCULAR | Status: DC | PRN
  Administered 2018-03-28: 10 mg via INTRAVENOUS

## 2018-03-28 MED ORDER — OXYCODONE HCL 5 MG PO TABS
5.0000 mg | ORAL_TABLET | ORAL | Status: DC | PRN
  Administered 2018-03-28: 5 mg via ORAL
  Filled 2018-03-28: qty 1

## 2018-03-28 MED ORDER — MIDAZOLAM HCL 2 MG/2ML IJ SOLN
INTRAMUSCULAR | Status: AC
  Filled 2018-03-28: qty 2

## 2018-03-28 MED ORDER — PROPOFOL 10 MG/ML IV BOLUS
INTRAVENOUS | Status: AC
  Filled 2018-03-28: qty 40

## 2018-03-28 MED ORDER — BUPIVACAINE HCL (PF) 0.5 % IJ SOLN
INTRAMUSCULAR | Status: AC
  Filled 2018-03-28: qty 30

## 2018-03-28 MED ORDER — ACETAMINOPHEN 500 MG PO TABS
1000.0000 mg | ORAL_TABLET | Freq: Four times a day (QID) | ORAL | Status: DC
  Administered 2018-03-28 – 2018-03-29 (×3): 1000 mg via ORAL
  Filled 2018-03-28 (×3): qty 2

## 2018-03-28 MED ORDER — SUGAMMADEX SODIUM 200 MG/2ML IV SOLN
INTRAVENOUS | Status: DC | PRN
  Administered 2018-03-28: 100 mg via INTRAVENOUS

## 2018-03-28 MED ORDER — KETOROLAC TROMETHAMINE 15 MG/ML IJ SOLN
15.0000 mg | Freq: Four times a day (QID) | INTRAMUSCULAR | Status: DC
  Administered 2018-03-28 – 2018-03-29 (×3): 15 mg via INTRAVENOUS
  Filled 2018-03-28 (×3): qty 1

## 2018-03-28 MED ORDER — SUGAMMADEX SODIUM 200 MG/2ML IV SOLN
INTRAVENOUS | Status: AC
  Filled 2018-03-28: qty 2

## 2018-03-28 MED ORDER — MIDAZOLAM HCL 2 MG/2ML IJ SOLN
INTRAMUSCULAR | Status: DC | PRN
  Administered 2018-03-28: 2 mg via INTRAVENOUS

## 2018-03-28 MED ORDER — SODIUM CHLORIDE 0.9 % IV SOLN
INTRAVENOUS | Status: DC | PRN
  Administered 2018-03-29: 01:00:00 via INTRAVENOUS

## 2018-03-28 MED ORDER — KETOROLAC TROMETHAMINE 30 MG/ML IJ SOLN
INTRAMUSCULAR | Status: DC | PRN
  Administered 2018-03-28: 30 mg via INTRAVENOUS

## 2018-03-28 MED ORDER — FENTANYL CITRATE (PF) 100 MCG/2ML IJ SOLN
INTRAMUSCULAR | Status: DC | PRN
  Administered 2018-03-28 (×2): 50 ug via INTRAVENOUS

## 2018-03-28 MED ORDER — LIDOCAINE HCL (PF) 2 % IJ SOLN
INTRAMUSCULAR | Status: AC
  Filled 2018-03-28: qty 10

## 2018-03-28 MED ORDER — LACTATED RINGERS IV SOLN
INTRAVENOUS | Status: DC
  Administered 2018-03-28: 13:00:00 via INTRAVENOUS

## 2018-03-28 MED ORDER — KETOROLAC TROMETHAMINE 30 MG/ML IJ SOLN
INTRAMUSCULAR | Status: AC
  Filled 2018-03-28: qty 1

## 2018-03-28 MED ORDER — PROPOFOL 10 MG/ML IV BOLUS
INTRAVENOUS | Status: DC | PRN
  Administered 2018-03-28: 150 mg via INTRAVENOUS

## 2018-03-28 SURGICAL SUPPLY — 39 items
APPLIER CLIP ROT 10 11.4 M/L (STAPLE) ×3
CATH REDDICK CHOLANGI 4FR 50CM (CATHETERS) ×3 IMPLANT
CHLORAPREP W/TINT 26ML (MISCELLANEOUS) ×3 IMPLANT
CLIP APPLIE ROT 10 11.4 M/L (STAPLE) ×1 IMPLANT
COVER WAND RF STERILE (DRAPES) ×3 IMPLANT
DECANTER SPIKE VIAL GLASS SM (MISCELLANEOUS) ×3 IMPLANT
DERMABOND ADVANCED (GAUZE/BANDAGES/DRESSINGS) ×2
DERMABOND ADVANCED .7 DNX12 (GAUZE/BANDAGES/DRESSINGS) ×1 IMPLANT
DRAPE SHEET LG 3/4 BI-LAMINATE (DRAPES) ×3 IMPLANT
DRESSING SURGICEL FIBRLLR 1X2 (HEMOSTASIS) IMPLANT
DRSG SURGICEL FIBRILLAR 1X2 (HEMOSTASIS)
ELECT REM PT RETURN 9FT ADLT (ELECTROSURGICAL) ×3
ELECTRODE REM PT RTRN 9FT ADLT (ELECTROSURGICAL) ×1 IMPLANT
GLOVE BIO SURGEON STRL SZ7 (GLOVE) ×3 IMPLANT
GLOVE BIOGEL PI IND STRL 7.5 (GLOVE) ×1 IMPLANT
GLOVE BIOGEL PI INDICATOR 7.5 (GLOVE) ×2
GOWN STRL REUS W/ TWL LRG LVL3 (GOWN DISPOSABLE) ×3 IMPLANT
GOWN STRL REUS W/TWL LRG LVL3 (GOWN DISPOSABLE) ×6
GRASPER SUT TROCAR 14GX15 (MISCELLANEOUS) ×3 IMPLANT
IRRIGATION STRYKERFLOW (MISCELLANEOUS) IMPLANT
IRRIGATOR STRYKERFLOW (MISCELLANEOUS)
IV NS 1000ML (IV SOLUTION)
IV NS 1000ML BAXH (IV SOLUTION) IMPLANT
KIT TURNOVER KIT A (KITS) ×3 IMPLANT
NEEDLE HYPO 22GX1.5 SAFETY (NEEDLE) ×3 IMPLANT
NEEDLE INSUFFLATION 14GA 120MM (NEEDLE) ×3 IMPLANT
NS IRRIG 1000ML POUR BTL (IV SOLUTION) ×3 IMPLANT
PACK LAP CHOLECYSTECTOMY (MISCELLANEOUS) ×3 IMPLANT
PORT ACCESS TROCAR AIRSEAL 12 (TROCAR) ×1 IMPLANT
PORT ACCESS TROCAR AIRSEAL 5M (TROCAR) ×2
POUCH SPECIMEN RETRIEVAL 10MM (ENDOMECHANICALS) ×3 IMPLANT
SCISSORS METZENBAUM CVD 33 (INSTRUMENTS) ×3 IMPLANT
SET TRI-LUMEN FLTR TB AIRSEAL (TUBING) ×3 IMPLANT
SLEEVE ENDOPATH XCEL 5M (ENDOMECHANICALS) ×6 IMPLANT
SUT MNCRL AB 4-0 PS2 18 (SUTURE) ×3 IMPLANT
SUT VICRYL 0 UR6 27IN ABS (SUTURE) ×3 IMPLANT
SUT VICRYL AB 3-0 FS1 BRD 27IN (SUTURE) ×3 IMPLANT
SYR 20CC LL (SYRINGE) IMPLANT
TROCAR XCEL NON-BLD 5MMX100MML (ENDOMECHANICALS) ×3 IMPLANT

## 2018-03-28 NOTE — Anesthesia Preprocedure Evaluation (Signed)
Anesthesia Evaluation  Patient identified by MRN, date of birth, ID band Patient awake    Reviewed: Allergy & Precautions, H&P , NPO status , Patient's Chart, lab work & pertinent test results, reviewed documented beta blocker date and time   Airway Mallampati: II  TM Distance: >3 FB Neck ROM: full    Dental  (+) Teeth Intact   Pulmonary neg pulmonary ROS,    Pulmonary exam normal        Cardiovascular Exercise Tolerance: Poor negative cardio ROS Normal cardiovascular exam Rhythm:regular Rate:Normal     Neuro/Psych negative neurological ROS  negative psych ROS   GI/Hepatic negative GI ROS, Neg liver ROS,   Endo/Other  negative endocrine ROS  Renal/GU negative Renal ROS  negative genitourinary   Musculoskeletal   Abdominal   Peds  Hematology negative hematology ROS (+)   Anesthesia Other Findings Past Medical History: No date: Allergic rhinitis No date: Arthritis No date: Hyperlipemia No date: Obesity No date: Varicella Past Surgical History: No date: COLONOSCOPY 02/20/2017: COLONOSCOPY WITH PROPOFOL; N/A     Comment:  Procedure: COLONOSCOPY WITH PROPOFOL;  Surgeon:               Christena DeemSkulskie, Martin U, MD;  Location: [facility] ENDOSCOPY;                Service: Endoscopy;  Laterality: N/A; No date: EYE SURGERY 08/11/2013,10/27/2013: JOINT REPLACEMENT; Bilateral No date: TUBAL LIGATION BMI    Body Mass Index:  38.07 kg/m     Reproductive/Obstetrics negative OB ROS                             Anesthesia Physical Anesthesia Plan  ASA: III  Anesthesia Plan: General ETT   Post-op Pain Management:    Induction:   PONV Risk Score and Plan:   Airway Management Planned:   Additional Equipment:   Intra-op Plan:   Post-operative Plan:   Informed Consent: I have reviewed the patients History and Physical, chart, labs and discussed the procedure including the risks, benefits and  alternatives for the proposed anesthesia with the patient or authorized representative who has indicated his/her understanding and acceptance.   Dental Advisory Given  Plan Discussed with: CRNA  Anesthesia Plan Comments:         Anesthesia Quick Evaluation

## 2018-03-28 NOTE — Anesthesia Post-op Follow-up Note (Signed)
Anesthesia QCDR form completed.        

## 2018-03-28 NOTE — Op Note (Signed)
SURGICAL OPERATIVE REPORT   DATE OF PROCEDURE: 03/28/2018  ATTENDING Surgeon(s): Vickie Epley, MD  ANESTHESIA: GETA  PRE-OPERATIVE DIAGNOSIS: Gallstone Pancreatitis (K85.10)  POST-OPERATIVE DIAGNOSIS: Gallstone Pancreatitis (K85.10) with mild acute calculous cholecystitis  PROCEDURE(S): (cpt's: 25427) 1.) Laparoscopic Cholecystectomy 2.) Intra-operative Cholangiogram  INTRAOPERATIVE FINDINGS: Mild pericholecystic inflammation with cystic duct and cystic artery clips well-secured, cystic/common/hepatic bile ducts all widely patent without filling defect(s) and drainage of contrast into duodenum, hemostasis at completion of procedure  INTRAOPERATIVE FLUIDS: 900 mL crystalloid   ESTIMATED BLOOD LOSS: 30 mL  URINE OUTPUT: No foley  SPECIMENS: Gallbladder  IMPLANTS: None  DRAINS: None   COMPLICATIONS: None apparent   CONDITION AT COMPLETION: Hemodynamically stable and extubated  DISPOSITION: PACU   INDICATION(S) FOR PROCEDURE:  Patient is a 72 y.o. female who this admission presented with acute severe epigastric abdominal pain. Ultrasound suggested cholelithiasis without sonographic evidence of cholecystitis and non-dilated common bile duct. Bilirubin and lipase were both elevated (along with AST/ALT/alk phos), suggestive of gallstone pancreatitis. LFT's and lipase precipitously improved, as did patient's symptoms, and MRCP did not demonstrate choledocholithiasis. All risks, benefits, and alternatives to cholecystectomy with intra-operative cholangiogram were discussed with patient, who elected to proceed, and informed consent was obtained.   DETAILS OF PROCEDURE:  Patient was brought to the operating suite and appropriately identified. General anesthesia was administered along with peri-operative prophylactic IV antibiotics, and endotracheal intubation was performed by anesthesiologist. In supine position, operative site was prepped and draped in usual sterile fashion,  and following a brief time out, initial 5 mm incision was made in a natural skin crease just above the umbilicus. Fascia was then elevated, and a Verress needle was inserted and its proper position confirmed using aspiration and saline meniscus test.  Upon insufflation of the abdominal cavity with carbon dioxide to a well-tolerated pressure of 12-15 mmHg, 5 mm peri-umbilical port followed by laparoscope were inserted and used to inspect the abdominal cavity and its contents with no injuries from insertion of the first trochar noted. Three additional trocars were inserted, one at the epigastric position (10 mm) and two along the Right costal margin (5 mm). The table was then placed in reverse Trendelenburg position with the Right side up. Filmy adhesions between the gallbladder and omentum/duodenum/transverse colon were lysed using combined blunt dissection and selective electrocautery. The apex/dome of the gallbladder was grasped with an atraumatic grasper passed through the lateral port and retracted apically over the liver. The infundibulum was also grasped and retracted, exposing Calot's triangle. The peritoneum overlying the gallbladder infundibulum was incised and dissected free of surrounding peritoneal attachments, revealing what was believed to be the cystic duct, which was clipped once on the gallbladder specimen side and what was believed to be ductotomy was made just below the placed clip. However, with small incision, a small amount of pulsatile bleeding was observed, and 2 clips were promptly placed across the patient side of what was found to be the cystic artery running anterior to patient's cystic duct.  Cystic duct was dissected free of surrounding tissues, and a single clip was placed across the gallbladder specimen side, and a small ductotomy was made, through which cholangiocatheter was advanced, secured, and used to perform cholangiogram, which demonstrated cystic/common/hepatic bile ducts  all widely patent without filling defect(s) and drainage of contrast into duodenum. Cholangiocatheter was then removed, and 2 clips were placed across the cystic duct below the ductotomy, between which the cystic duct was transected. The gallbladder was then dissected from its peritoneal  attachments to the liver using electrocautery, and the gallbladder was placed into a laparoscopic specimen bag and removed from the abdominal cavity via the epigastric port site. Hemostasis and secure placement of clips were confirmed, and intra-peritoneal cavity was inspected with no additional findings. PMI laparoscopic fascial closure device was then used to re-approximate fascia at the 10 mm epigastric port site.  All ports were then removed under direct visualization, and abdominal cavity was desuflated. All port sites were irrigated/cleaned, additional local anesthetic was injected at each incision, 3-0 Vicryl was used to re-approximate dermis at 10 mm port site(s), and subcuticular 4-0 Monocryl suture was used to re-approximate skin. Skin was then cleaned, dried, and sterile skin glue was applied. Patient was then safely able to be awakened, extubated, and transferred to PACU for post-operative monitoring and care.   I was present for all aspects of the above procedure, and no operative complications were apparent.

## 2018-03-28 NOTE — Transfer of Care (Signed)
Immediate Anesthesia Transfer of Care Note  Patient: Ellen [name]  Procedure(s) Performed: LAPAROSCOPIC CHOLECYSTECTOMY WITH INTRAOPERATIVE CHOLANGIOGRAM (N/A Abdomen)  Patient Location: PACU  Anesthesia Type:General  Level of Consciousness: awake and drowsy  Airway & Oxygen Therapy: Patient Spontanous Breathing and Patient connected to face mask oxygen  Post-op Assessment: Report given to RN and Post -op Vital signs reviewed and stable  Post vital signs: Reviewed and stable  Last Vitals:  Vitals Value Taken Time  BP 157/77 03/28/2018 12:19 PM  Temp 36.6 C 03/28/2018 12:19 PM  Pulse 84 03/28/2018 12:25 PM  Resp 16 03/28/2018 12:25 PM  SpO2 99 % 03/28/2018 12:25 PM  Vitals shown include unvalidated device data.  Last Pain:  Vitals:   03/28/18 1219  TempSrc:   PainSc: Asleep         Complications: No apparent anesthesia complications

## 2018-03-28 NOTE — Progress Notes (Signed)
[facility] [place] [facility]   PATIENT NAME: Ellen [name]    MR#:  [id number]  DATE OF BIRTH:  Feb 08, 1946  SUBJECTIVE:  Status post laparoscopic cholecystectomy today.  REVIEW OF SYSTEMS:   Review of Systems  Constitutional: Negative for chills, fever and weight loss.  HENT: Negative for ear discharge, ear pain and nosebleeds.   Eyes: Negative for blurred vision, pain and discharge.  Respiratory: Negative for sputum production, shortness of breath, wheezing and stridor.   Cardiovascular: Negative for chest pain, palpitations, orthopnea and PND.  Gastrointestinal: Negative for abdominal pain, diarrhea, nausea and vomiting.  Genitourinary: Negative for frequency and urgency.  Musculoskeletal: Negative for back pain and joint pain.  Neurological: Negative for sensory change, speech change, focal weakness and weakness.  Psychiatric/Behavioral: Negative for depression and hallucinations. The patient is not nervous/anxious.    Tolerating Diet:npo Tolerating PT:   DRUG ALLERGIES:  No Known Allergies  VITALS:  Blood pressure (!) 145/66, pulse 76, temperature 97.9 F (36.6 C), resp. rate 11, height 5\' 1"  (1.549 m), weight 91.4 kg, SpO2 96 %.  PHYSICAL EXAMINATION:   Physical Exam  GENERAL:  72 y.o.-year-old patient lying in the bed with no acute distress. obese EYES: Pupils equal, round, reactive to light and accommodation. No scleral icterus. Extraocular muscles intact.  HEENT: Head atraumatic, normocephalic. Oropharynx and nasopharynx clear.  NECK:  Supple, no jugular venous distention. No thyroid enlargement, no tenderness.  LUNGS: Normal breath sounds bilaterally, no wheezing, rales, rhonchi. No use of accessory muscles of respiration.  CARDIOVASCULAR: S1, S2 normal. No murmurs, rubs, or gallops.  ABDOMEN: Dressing present.  No other complaints.Marland Kitchen  EXTREMITIES: No cyanosis, clubbing or edema b/l.    NEUROLOGIC: Cranial nerves II through XII are  intact. No focal Motor or sensory deficits b/l.   PSYCHIATRIC:  patient is alert and oriented x 3.  SKIN: No obvious rash, lesion, or ulcer.   LABORATORY PANEL:  CBC Recent Labs  Lab 03/28/18 0518  WBC 6.0  HGB 11.3*  HCT 35.3*  PLT 229    Chemistries  Recent Labs  Lab 03/28/18 0518  NA 141  K 4.1  CL 114*  CO2 19*  GLUCOSE 66*  BUN 12  CREATININE 0.62  CALCIUM 8.5*  AST 82*  ALT 161*  ALKPHOS 87  BILITOT 1.4*   Cardiac Enzymes No results for input(s): TROPONINI in the last 168 hours. RADIOLOGY:  Dg Cholangiogram Operative  Result Date: 03/28/2018 CLINICAL DATA:  Cholelithiasis EXAM: INTRAOPERATIVE CHOLANGIOGRAM TECHNIQUE: Cholangiographic images from the C-arm fluoroscopic device were submitted for interpretation post-operatively. Please see the procedural report for the amount of contrast and the fluoroscopy time utilized. COMPARISON:  MR 03/28/2018 and previous FINDINGS: No persistent filling defects in the common duct. The laparotomy hardware partially obscures the CBD. Intrahepatic ducts are incompletely visualized, appearing decompressed centrally. Contrast passes into the duodenum. : No evidence of retained common duct stone. Electronically Signed   By: Corlis Leak M.D.   On: 03/28/2018 12:13   Ct Abdomen Pelvis W Contrast  Result Date: 03/26/2018 CLINICAL DATA:  Abdominal pain EXAM: CT ABDOMEN AND PELVIS WITH CONTRAST TECHNIQUE: Multidetector CT imaging of the abdomen and pelvis was performed using the standard protocol following bolus administration of intravenous contrast. CONTRAST:  ISOVUE-300 IOPAMIDOL (ISOVUE-300) INJECTION 61% COMPARISON:  Ultrasound from earlier in the same day. FINDINGS: Lower chest: No acute abnormality. Hepatobiliary: Liver is diffusely decreased in attenuation consistent with fatty infiltration. Cholelithiasis is noted similar to that seen  on prior ultrasound examination. Pancreas: Unremarkable. No pancreatic ductal dilatation or  surrounding inflammatory changes. Spleen: Scattered calcified granulomas are noted. No splenic mass is seen. Adrenals/Urinary Tract: The odontoid is within normal limits. The kidneys are well visualized bilaterally. No renal calculi or obstructive changes are noted. Bladder is partially distended. Stomach/Bowel: Diverticular change of the colon is noted. No evidence of diverticulitis is seen. The appendix is within normal limits. No small bowel abnormality is seen. The stomach is unremarkable with the exception of a small sliding-type hiatal hernia. Vascular/Lymphatic: Aortic atherosclerosis. No enlarged abdominal or pelvic lymph nodes. Reproductive: Uterus and bilateral adnexa are unremarkable. Other: No abdominal wall hernia or abnormality. No abdominopelvic ascites. Musculoskeletal: Degenerative changes of the lumbar spine are noted. 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 IMPRESSION: Cholelithiasis without complicating factors. Diverticulosis without diverticulitis. Chronic changes as described above. Electronically Signed   By: Alcide Clever M.D.   On: 03/26/2018 18:03   Mr Abdomen Mrcp Wo Contrast  Result Date: 03/28/2018 CLINICAL DATA:  Elevated liver function tests. Cholelithiasis. Abdominal pain attributed to gallstone pancreatitis. EXAM: MRI ABDOMEN WITHOUT CONTRAST  (INCLUDING MRCP) TECHNIQUE: Multiplanar multisequence MR imaging of the abdomen was performed. Heavily T2-weighted images of the biliary and pancreatic ducts were obtained, and three-dimensional MRCP images were rendered by post processing. COMPARISON:  03/26/2018 CT.  Abdominal ultrasound 03/26/2018. FINDINGS: Portions of exam are mildly motion degraded. Lower chest: Normal heart size without pericardial or pleural effusion. Hepatobiliary: Mild hepatic steatosis. Multiple small gallstones. No intrahepatic biliary duct dilatation. Normal common duct caliber, including at 6  mm. No choledocholithiasis or obstructive mass. Pancreas: There may be mild peripancreatic edema, including adjacent the tail on image 20/8. No pancreatic duct dilatation, well-defined peripancreatic fluid collection. Spleen:  Normal in size, without focal abnormality. Adrenals/Urinary Tract: Normal adrenal glands. Normal noncontrast appearance of the kidneys, without hydronephrosis. Stomach/Bowel: Normal stomach and abdominal bowel loops. Vascular/Lymphatic: Normal aortic caliber. No abdominal adenopathy. Other:  No ascites. Musculoskeletal: No acute osseous abnormality. IMPRESSION: 1. Cholelithiasis without acute cholecystitis, biliary duct dilatation, or choledocholithiasis. 2. Possible peripancreatic edema, mild. No evidence of acute complication. 3. Mild hepatic steatosis. 4. Mild motion degradation. Electronically Signed   By: Jeronimo Greaves M.D.   On: 03/28/2018 07:20   US Abdomen Limited Ruq  Result Date: 03/26/2018 CLINICAL DATA:  Upper abdominal pain EXAM: ULTRASOUND ABDOMEN LIMITED RIGHT UPPER QUADRANT COMPARISON:  None. FINDINGS: Gallbladder: Within the gallbladder, there are echogenic foci which shadow and move slightly consistent with cholelithiasis. Largest gallstone measures 1.0 cm in length. There is no appreciable gallbladder wall thickening or pericholecystic fluid. No sonographic Murphy sign noted by sonographer. Common bile duct: Diameter: 4 mm. No intrahepatic or extrahepatic biliary duct dilatation. Liver: No focal lesion identified. Liver echogenicity is increased diffusely. Portal vein is patent on color Doppler imaging with normal direction of blood flow towards the liver. IMPRESSION: 1. Cholelithiasis. No appreciable gallbladder wall thickening or pericholecystic fluid. 2. Increase in liver echogenicity, a finding felt to be indicative of hepatic steatosis. While no focal liver lesions are evident, it must be cautioned that the sensitivity of ultrasound for detection of focal liver  lesions is diminished in this circumstance. Electronically Signed   By: Bretta Bang III M.D.   On: 03/26/2018 17:18   ASSESSMENT AND PLAN:   Ellen [name]  is a 72 y.o. female with a known history of essential hypertension, hyperlipidemia comes in because of epigastric abdominal  pain radiating to the back since yesterday, associated with nausea, vomiting.  Patient has epigastric pain 7 out of 10 in severity associated nausea, vomiting since yesterday  1. Acute severe pancreatitis suspected due to Gall stones, , continue Rocephin.  Continue n.p.o., continue IV fluids, lipase level improved.,  Patient LFTs are also improving.    post laparoscopic cholecystectomy by surgery.  Today.  2.  Essential hypertension: Controlled  3. DVT prophylaxis lovenox SQ  Case discussed with Care Management/Social Worker. Management plans discussed with the patient, family and they are in agreement.  CODE STATUS: FULL  DVT Prophylaxis: lovenox  TOTAL TIME TAKING CARE OF THIS PATIENT: *40 minutes.  >50% time spent on counselling and coordination of care  POSSIBLE D/C IN *2-3* DAYS, DEPENDING ON CLINICAL CONDITION.  Note: This dictation was prepared with Dragon dictation along with smaller phrase technology. Any transcriptional errors that result from this process are unintentional.  Katha HammingSnehalatha Pleas Carneal M.D on 03/28/2018 at 1:19 PM  Between 7am to 6pm - Pager - [phone]  After 6pm go to [email or web address] - password [email or web address]  [facility]  Office  [phone]  CC: Primary care physician; Rayetta HumphreyGeorge, Sionne A, MDPatient ID: Ellen GaveAlice T [name], female   DOB: 10/18/1945, 72 y.o.   MRN: [id number]

## 2018-03-29 ENCOUNTER — Encounter: Payer: Self-pay | Admitting: Surgery

## 2018-03-29 LAB — COMPREHENSIVE METABOLIC PANEL
ALT: 111 U/L — ABNORMAL HIGH (ref 0–44)
AST: 53 U/L — ABNORMAL HIGH (ref 15–41)
Albumin: 3 g/dL — ABNORMAL LOW (ref 3.5–5.0)
Alkaline Phosphatase: 66 U/L (ref 38–126)
Anion gap: 7 (ref 5–15)
BUN: 16 mg/dL (ref 8–23)
CHLORIDE: 113 mmol/L — AB (ref 98–111)
CO2: 20 mmol/L — ABNORMAL LOW (ref 22–32)
Calcium: 8.3 mg/dL — ABNORMAL LOW (ref 8.9–10.3)
Creatinine, Ser: 0.74 mg/dL (ref 0.44–1.00)
GFR calc Af Amer: >60 mL/min (ref >60)
GFR calc non Af Amer: >60 mL/min (ref >60)
Glucose, Bld: 105 mg/dL — ABNORMAL HIGH (ref 70–99)
Potassium: 4.1 mmol/L (ref 3.5–5.1)
SODIUM: 140 mmol/L (ref 135–145)
Total Bilirubin: 0.9 mg/dL (ref 0.3–1.2)
Total Protein: 5.4 g/dL — ABNORMAL LOW (ref 6.5–8.1)

## 2018-03-29 LAB — GLUCOSE, CAPILLARY: Glucose-Capillary: 87 mg/dL (ref 70–99)

## 2018-03-29 LAB — SURGICAL PATHOLOGY

## 2018-03-29 MED ORDER — ONDANSETRON 4 MG PO TBDP: 4.0000 mg | ORAL_TABLET | Freq: Three times a day (TID) | ORAL | 0 refills | Status: DC | PRN

## 2018-03-29 MED ORDER — OXYCODONE HCL 5 MG PO TABS: 5.0000 mg | ORAL_TABLET | ORAL | 0 refills | Status: DC | PRN

## 2018-03-29 MED ORDER — METRONIDAZOLE 500 MG PO TABS: 500.0000 mg | ORAL_TABLET | Freq: Two times a day (BID) | ORAL | 0 refills | Status: AC

## 2018-03-29 MED ORDER — CIPROFLOXACIN HCL 500 MG PO TABS: 500.0000 mg | ORAL_TABLET | Freq: Two times a day (BID) | ORAL | 0 refills | Status: DC

## 2018-03-29 MED ORDER — CIPROFLOXACIN HCL 500 MG PO TABS: 500.0000 mg | ORAL_TABLET | Freq: Two times a day (BID) | ORAL | 0 refills | Status: AC

## 2018-03-29 NOTE — Progress Notes (Signed)
Patient is postop day 1 for lap chole secondary to gallstone pancreatitis, patient has some abdominal soreness but overall feeling better, tolerating the clear liquid diet.  I spoke with surgery PA, patient wants to go home and she is feeling better no further abdominal pain or fever.  Hemodynamically stable.  We will discharge her home today with Cipro, Flagyl for 3 more days, continue pain medicine and also nausea medicine for couple of more days and see how she does.  Patient can resume her statins from Monday.  Can resume her home blood pressure medicines.  Discharge instructions are in the computer, stable for discharge home today.

## 2018-03-29 NOTE — Progress Notes (Signed)
03/29/2018 11:46 AM  Dickey GaveAlice T Dominique to be D/C'd Home per MD order.  Discussed prescriptions and follow up appointments with the patient. Prescriptions given to patient, medication list explained in detail. Pt verbalized understanding.  Allergies as of 03/29/2018   No Known Allergies     Medication List    TAKE these medications   acetaminophen 500 MG tablet Commonly known as:  TYLENOL Take 500-1,000 mg by mouth every 6 (six) hours as needed for mild pain or moderate pain.   aspirin EC 81 MG tablet Take 81 mg daily by mouth.   cholecalciferol 1000 units tablet Commonly known as:  VITAMIN D Take 5,000 Units by mouth daily.   ciprofloxacin 500 MG tablet Commonly known as:  CIPRO Take 1 tablet (500 mg total) by mouth 2 (two) times daily for 3 days.   enalapril 10 MG tablet Commonly known as:  VASOTEC Take 10 mg daily by mouth.   fluticasone 50 MCG/ACT nasal spray Commonly known as:  FLONASE Place 2 sprays into both nostrils daily as needed for allergies or rhinitis.   hydrochlorothiazide 12.5 MG tablet Commonly known as:  HYDRODIURIL Take 12.5 mg daily by mouth.   ibuprofen 200 MG tablet Commonly known as:  ADVIL,MOTRIN Take 200-400 mg by mouth every 6 (six) hours as needed for mild pain or moderate pain.   metroNIDAZOLE 500 MG tablet Commonly known as:  FLAGYL Take 1 tablet (500 mg total) by mouth 2 (two) times daily for 3 days.   ondansetron 4 MG disintegrating tablet Commonly known as:  ZOFRAN ODT Take 1 tablet (4 mg total) by mouth every 8 (eight) hours as needed for nausea or vomiting.   oxyCODONE 5 MG immediate release tablet Commonly known as:  Oxy IR/ROXICODONE Take 1 tablet (5 mg total) by mouth every 4 (four) hours as needed for severe pain.   simvastatin 10 MG tablet Commonly known as:  ZOCOR Take 10 mg daily by mouth.       Vitals:   03/28/18 1919 03/29/18 0351  BP: (!) 101/48 (!) 157/66  Pulse: 76 85  Resp: 18 18  Temp: 98.3 F (36.8 C)  98.7 F (37.1 C)  SpO2: 96% 98%    Skin clean, dry and intact without evidence of skin break down, no evidence of skin tears noted. IV catheter discontinued intact. Site without signs and symptoms of complications. Dressing and pressure applied. Pt denies pain at this time. No complaints noted.  An After Visit Summary was printed and given to the patient. Patient escorted via WC, and D/C home via private auto.  Madie RenoMisty D Corran Lalone, RN

## 2018-03-29 NOTE — Progress Notes (Signed)
[facility] SURGICAL PROGRESS NOTE  Hospital Day(s): 3.   Post op day(s): 1 Day Post-Op.   Interval History: Patient seen and examined, no acute events or new complaints overnight. Patient reports that she has some abdominal soreness worse over her epigastric incision. No complaints of nausea or emesis. Tolerating clear liquid diet. Plans to mobilize this morning.   Review of Systems:  Constitutional: denies fever, chills  Respiratory: denies any shortness of breath  Cardiovascular: denies chest pain or palpitations  Gastrointestinal: + Abdominal soreness, denied N/V, or diarrhea/and bowel function as per interval history Musculoskeletal: denies pain, decreased motor or sensation Integumentary: denies any other rashes or skin discolorations except laparoscopic incisions  Vital signs in last 24 hours: [min-max] current  Temp:  [97.5 F (36.4 C)-98.7 F (37.1 C)] 98.7 F (37.1 C) (12/13 0351) Pulse Rate:  [67-89] 85 (12/13 0351) Resp:  [11-18] 18 (12/13 0351) BP: (101-157)/(48-77) 157/66 (12/13 0351) SpO2:  [91 %-100 %] 98 % (12/13 0351) Weight:  [91.4 kg-97.2 kg] 97.2 kg (12/13 0349)     Height: 5\' 1"  (154.9 cm) Weight: 97.2 kg BMI (Calculated): 40.51   Intake/Output this shift:  Total I/O In: 201.6 [I.V.:201.6] Out: -    Intake/Output last 2 shifts:  @IOLAST2SHIFTS @   Physical Exam:  Constitutional: alert, cooperative and no distress  Respiratory: breathing non-labored at rest  Gastrointestinal: soft, tenderness over epigastric incision, and non-distended Integumentary: Laparoscopic abdominal incisions are CDI, no erythema or drainage  Labs:  CBC Latest Ref Rng & Units 03/28/2018 03/27/2018 03/26/2018  WBC 4.0 - 10.5 K/uL 6.0 7.3 6.9  Hemoglobin 12.0 - 15.0 g/dL 11.3(L) 12.0 13.9  Hematocrit 36.0 - 46.0 % 35.3(L) 37.2 41.8  Platelets 150 - 400 K/uL 229 245 294   CMP Latest Ref Rng & Units 03/29/2018 03/28/2018 03/27/2018  Glucose 70 - 99 mg/dL 409(W105(H)  11(B66(L) 71  BUN 8 - 23 mg/dL 16 12 14   Creatinine 0.44 - 1.00 mg/dL 1.470.74 8.290.62 5.620.62  Sodium 135 - 145 mmol/L 140 141 140  Potassium 3.5 - 5.1 mmol/L 4.1 4.1 3.6  Chloride 98 - 111 mmol/L 113(H) 114(H) 112(H)  CO2 22 - 32 mmol/L 20(L) 19(L) 18(L)  Calcium 8.9 - 10.3 mg/dL 8.3(L) 8.5(L) 8.7(L)  Total Protein 6.5 - 8.1 g/dL 1.3(Y5.4(L) 5.9(L) 6.3(L)  Total Bilirubin 0.3 - 1.2 mg/dL 0.9 8.6(V1.4(H) 2.2(H)  Alkaline Phos 38 - 126 U/L 66 87 114  AST 15 - 41 U/L 53(H) 82(H) 204(H)  ALT 0 - 44 U/L 111(H) 161(H) 269(H)     Assessment/Plan: (ICD-10's: K85,1) 72 y.o. female with resolved hyperbilirubinemia who is 1 Day Post-Op s/p laparoscopic cholecystectomy with intraoperative cholangiogram for gallstone pancreatitis, complicated by pertinent comorbidities including HLD, arthritis, and obesity.   - Advance diet as tolerated, wean IVF   - Pain control as needed   - Mobilize   - Medical management of comorbidities  - Discharge Planning: Patient is clear for discharge from surgical standpoint, will provide prescription for pain control, and arrange follow in 2 weeks   All of the above findings and recommendations were discussed with the patient, and the medical team, and all of patient's  questions were answered to her expressed satisfaction.  -- Lynden OxfordZachary Rosene Pilling, PA-C [facility] 03/29/2018, 7:55 AM [phone] M-F: 7am - 4pm

## 2018-03-29 NOTE — Discharge Instructions (Signed)
In addition to included general post-operative instructions for laparoscopic cholecystectomy (Gallbladder removal),  Diet: Resume home healthy diet.   Activity: No heavy lifting >20 pounds (children, pets, laundry, garbage) or strenuous activity until follow-up, but light activity and walking are encouraged. Do not drive or drink alcohol if taking narcotic pain medications.  Wound care: 2 days after surgery (12/14), you may shower/get incision wet with soapy water and pat dry (do not rub incisions), but no baths or submerging incision underwater until follow-up.   Medications: Resume all home medications. For mild to moderate pain: acetaminophen (Tylenol) or ibuprofen/naproxen (if no kidney disease). Combining Tylenol with alcohol can substantially increase your risk of causing liver disease. Narcotic pain medications, if prescribed, can be used for severe pain, though may cause nausea, constipation, and drowsiness. Do not combine Tylenol and Percocet (or similar) within a 6 hour period as Percocet (and similar) contain(s) Tylenol. If you do not need the narcotic pain medication, you do not need to fill the prescription.  Call office [phone]) at any time if any questions, worsening pain, fevers/chills, bleeding, drainage from incision site, or other concerns.

## 2018-03-29 NOTE — Care Management Important Message (Signed)
Important Message  Patient Details  Name: Ellen [name] MRN: [id number] Date of Birth: 01/07/1946   Medicare Important Message Given:  Yes    Olegario MessierKathy A Harrell Niehoff 03/29/2018, 10:50 AM

## 2018-04-01 NOTE — Discharge Summary (Signed)
Ellen [name], is a 72 y.o. female  DOB 05/12/1945  MRN [id number].  Admission date:  03/26/2018  Admitting Physician  Katha HammingSnehalatha Ky Rumple, MD  Discharge Date: 03-29-2018.  Primary MD  Rayetta HumphreyGeorge, Sionne A, MD  Recommendations for primary care physician for things to follow:   Follow with PCP in 1 week Follow-up with Dr. Satira MccallumJason Davis as per schedule.  Appointment already is made.   Admission Diagnosis  RUQ pain [R10.11] Acute pancreatitis without infection or necrosis, unspecified pancreatitis type [K85.90]   Discharge Diagnosis  RUQ pain [R10.11] Acute pancreatitis without infection or necrosis, unspecified pancreatitis type [K85.90]   Active Problems:   Acute pancreatitis      Past Medical History:  Diagnosis Date  . Allergic rhinitis   . Arthritis   . Hyperlipemia   . Obesity   . Varicella     Past Surgical History:  Procedure Laterality Date  . CHOLECYSTECTOMY N/A 03/28/2018   Procedure: LAPAROSCOPIC CHOLECYSTECTOMY WITH INTRAOPERATIVE CHOLANGIOGRAM;  Surgeon: Ancil Linseyavis, Jason Evan, MD;  Location: [facility] ORS;  Service: General;  Laterality: N/A;  . COLONOSCOPY    . COLONOSCOPY WITH PROPOFOL N/A 02/20/2017   Procedure: COLONOSCOPY WITH PROPOFOL;  Surgeon: Christena DeemSkulskie, Martin U, MD;  [facility];  Service: Endoscopy;  Laterality: N/A;  . EYE SURGERY    . JOINT REPLACEMENT Bilateral 08/11/2013,10/27/2013  . TUBAL LIGATION         History of present illness and  Hospital Course:     Kindly see H&P for history of present illness and admission details, please review complete Labs, Consult reports and Test reports for all details in brief  HPI  from the history and physical done on the day of admission 72 year old female patient admitted for abdominal pain, nausea, vomiting, patient found to have acute  pancreatitis with lipase more than 2500 on admission.   Hospital Course  #1 .  Acute abdominal pain secondary to acute pancreatitis, patient found to have gallstones.  Received IV fluids, IV antibiotics, IV pain medicines, admitted to medical floor, seen by gastroenterology, surgery, patient CT abdomen, MRCP, ultrasound of abdomen showed gallstones, peripancreatic edema, patient had lap chole per surgery Dr. Earlene Plateravis, after the surgery patient LFTs normalized, lipase also normalized even before the surgery.  Patient tolerated the procedure well, discharged home with 2days supply of Cipro, Flagyl, limited prescription for oxycodone, Zofran.  Discussed with surgery.  Before discharging the patient. 2.  Essential hypertension: Continue patient's home BP medicines 3.  Hyperlipidemia: Patient statins were held on admission secondary to elevated LFTs, advised the patient to start statins, BP medicines in 2 days after the discharge.    Discharge Condition: Stable   Follow UP  Follow-up Information    Ancil Linseyavis, Jason Evan, MD. Go on 04/11/2018.   Specialty:  General Surgery Why:  Thursday December 26th at 9:15am for a follow-up  lap chole for gall stone pancreatitis Contact information: [address] [place] [address] [phone]        Rayetta HumphreyGeorge, Sionne A, MD. Go on 04/05/2018.   Specialty:  Family Medicine Why:  Friday December 20th at 9:05am for a follow-up appointment  Contact information: [address] [place] [id number] [phone]             Discharge Instructions  and  Discharge Medications      Allergies as of 03/29/2018   No Known Allergies     Medication List    TAKE these medications   acetaminophen 500 MG  tablet Commonly known as:  TYLENOL Take 500-1,000 mg by mouth every 6 (six) hours as needed for mild pain or moderate pain.   aspirin EC 81 MG tablet Take 81 mg daily by mouth.   cholecalciferol 1000 units tablet Commonly known  as:  VITAMIN D Take 5,000 Units by mouth daily.   ciprofloxacin 500 MG tablet Commonly known as:  CIPRO Take 1 tablet (500 mg total) by mouth 2 (two) times daily for 3 days.   enalapril 10 MG tablet Commonly known as:  VASOTEC Take 10 mg daily by mouth.   fluticasone 50 MCG/ACT nasal spray Commonly known as:  FLONASE Place 2 sprays into both nostrils daily as needed for allergies or rhinitis.   hydrochlorothiazide 12.5 MG tablet Commonly known as:  HYDRODIURIL Take 12.5 mg daily by mouth.   ibuprofen 200 MG tablet Commonly known as:  ADVIL,MOTRIN Take 200-400 mg by mouth every 6 (six) hours as needed for mild pain or moderate pain.   metroNIDAZOLE 500 MG tablet Commonly known as:  FLAGYL Take 1 tablet (500 mg total) by mouth 2 (two) times daily for 3 days.   ondansetron 4 MG disintegrating tablet Commonly known as:  ZOFRAN ODT Take 1 tablet (4 mg total) by mouth every 8 (eight) hours as needed for nausea or vomiting.   oxyCODONE 5 MG immediate release tablet Commonly known as:  Oxy IR/ROXICODONE Take 1 tablet (5 mg total) by mouth every 4 (four) hours as needed for severe pain.   simvastatin 10 MG tablet Commonly known as:  ZOCOR Take 10 mg daily by mouth.         Diet and Activity recommendation: See Discharge Instructions above   Consults obtained -gastroenterology, surgery   Major procedures and Radiology Reports - PLEASE review detailed and final reports for all details, in brief -      Dg Cholangiogram Operative  Result Date: 03/28/2018 CLINICAL DATA:  Cholelithiasis EXAM: INTRAOPERATIVE CHOLANGIOGRAM TECHNIQUE: Cholangiographic images from the C-arm fluoroscopic device were submitted for interpretation post-operatively. Please see the procedural report for the amount of contrast and the fluoroscopy time utilized. COMPARISON:  MR 03/28/2018 and previous FINDINGS: No persistent filling defects in the common duct. The laparotomy hardware partially obscures  the CBD. Intrahepatic ducts are incompletely visualized, appearing decompressed centrally. Contrast passes into the duodenum. : No evidence of retained common duct stone. Electronically Signed   By: Corlis Leak M.D.   On: 03/28/2018 12:13   Ct Abdomen Pelvis W Contrast  Result Date: 03/26/2018 CLINICAL DATA:  Abdominal pain EXAM: CT ABDOMEN AND PELVIS WITH CONTRAST TECHNIQUE: Multidetector CT imaging of the abdomen and pelvis was performed using the standard protocol following bolus administration of intravenous contrast. CONTRAST:  ISOVUE-300 IOPAMIDOL (ISOVUE-300) INJECTION 61% COMPARISON:  Ultrasound from earlier in the same day. FINDINGS: Lower chest: No acute abnormality. Hepatobiliary: Liver is diffusely decreased in attenuation consistent with fatty infiltration. Cholelithiasis is noted similar to that seen on prior ultrasound examination. Pancreas: Unremarkable. No pancreatic ductal dilatation or surrounding inflammatory changes. Spleen: Scattered calcified granulomas are noted. No splenic mass is seen. Adrenals/Urinary Tract: The odontoid is within normal limits. The kidneys are well visualized bilaterally. No renal calculi or obstructive changes are noted. Bladder is partially distended. Stomach/Bowel: Diverticular change of the colon is noted. No evidence of diverticulitis is seen. The appendix is within normal limits. No small bowel abnormality is seen. The stomach is unremarkable with the exception of a small sliding-type hiatal hernia. Vascular/Lymphatic: Aortic atherosclerosis. No enlarged  abdominal or pelvic lymph nodes. Reproductive: Uterus and bilateral adnexa are unremarkable. Other: No abdominal wall hernia or abnormality. No abdominopelvic ascites. Musculoskeletal: Degenerative changes of the lumbar spine are noted. 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 IMPRESSION: Cholelithiasis without complicating factors.  Diverticulosis without diverticulitis. Chronic changes as described above. Electronically Signed   By: Alcide Clever M.D.   On: 03/26/2018 18:03   Mr Abdomen Mrcp Wo Contrast  Result Date: 03/28/2018 CLINICAL DATA:  Elevated liver function tests. Cholelithiasis. Abdominal pain attributed to gallstone pancreatitis. EXAM: MRI ABDOMEN WITHOUT CONTRAST  (INCLUDING MRCP) TECHNIQUE: Multiplanar multisequence MR imaging of the abdomen was performed. Heavily T2-weighted images of the biliary and pancreatic ducts were obtained, and three-dimensional MRCP images were rendered by post processing. COMPARISON:  03/26/2018 CT.  Abdominal ultrasound 03/26/2018. FINDINGS: Portions of exam are mildly motion degraded. Lower chest: Normal heart size without pericardial or pleural effusion. Hepatobiliary: Mild hepatic steatosis. Multiple small gallstones. No intrahepatic biliary duct dilatation. Normal common duct caliber, including at 6 mm. No choledocholithiasis or obstructive mass. Pancreas: There may be mild peripancreatic edema, including adjacent the tail on image 20/8. No pancreatic duct dilatation, well-defined peripancreatic fluid collection. Spleen:  Normal in size, without focal abnormality. Adrenals/Urinary Tract: Normal adrenal glands. Normal noncontrast appearance of the kidneys, without hydronephrosis. Stomach/Bowel: Normal stomach and abdominal bowel loops. Vascular/Lymphatic: Normal aortic caliber. No abdominal adenopathy. Other:  No ascites. Musculoskeletal: No acute osseous abnormality. IMPRESSION: 1. Cholelithiasis without acute cholecystitis, biliary duct dilatation, or choledocholithiasis. 2. Possible peripancreatic edema, mild. No evidence of acute complication. 3. Mild hepatic steatosis. 4. Mild motion degradation. Electronically Signed   By: Jeronimo Greaves M.D.   On: 03/28/2018 07:20   US Abdomen Limited Ruq  Result Date: 03/26/2018 CLINICAL DATA:  Upper abdominal pain EXAM: ULTRASOUND ABDOMEN LIMITED  RIGHT UPPER QUADRANT COMPARISON:  None. FINDINGS: Gallbladder: Within the gallbladder, there are echogenic foci which shadow and move slightly consistent with cholelithiasis. Largest gallstone measures 1.0 cm in length. There is no appreciable gallbladder wall thickening or pericholecystic fluid. No sonographic Murphy sign noted by sonographer. Common bile duct: Diameter: 4 mm. No intrahepatic or extrahepatic biliary duct dilatation. Liver: No focal lesion identified. Liver echogenicity is increased diffusely. Portal vein is patent on color Doppler imaging with normal direction of blood flow towards the liver. IMPRESSION: 1. Cholelithiasis. No appreciable gallbladder wall thickening or pericholecystic fluid. 2. Increase in liver echogenicity, a finding felt to be indicative of hepatic steatosis. While no focal liver lesions are evident, it must be cautioned that the sensitivity of ultrasound for detection of focal liver lesions is diminished in this circumstance. Electronically Signed   By: Bretta Bang III M.D.   On: 03/26/2018 17:18    Micro Results blood type" unknown type and found so no  Recent Results (from the past 240 hour(s))  MRSA PCR Screening     Status: None   Collection Time: 03/28/18  5:48 AM  Result Value Ref Range Status   MRSA by PCR NEGATIVE NEGATIVE Final    Comment:        The GeneXpert MRSA Assay (FDA approved for NASAL specimens only), is one component of a comprehensive MRSA colonization surveillance program. It is not intended to diagnose MRSA infection nor to guide or monitor treatment for MRSA infections. Performed at [facility], [address] [place], [place] [address]        Today   Subjective:  Ellen [name] today has no headache,no chest abdominal pain,no new weakness tingling or numbness, feels much better wants to go home today.   Objective:   Blood pressure (!) 157/66, pulse 85, temperature 98.7 F (37.1 C), temperature source  Oral, resp. rate 18, height 5\' 1"  (1.549 m), weight 97.2 kg, SpO2 98 %.  No intake or output data in the 24 hours ending 04/01/18 2018  Exam Awake Alert, Oriented x 3, No new F.N deficits, Normal affect Cole.AT,PERRAL Supple Neck,No JVD, No cervical lymphadenopathy appriciated.  Symmetrical Chest wall movement, Good air movement bilaterally, CTAB RRR,No Gallops,Rubs or new Murmurs, No Parasternal Heave +ve B.Sounds, Abd Soft, Non tender, No organomegaly appriciated, No rebound -guarding or rigidity. No Cyanosis, Clubbing or edema, No new Rash or bruise  Data Review   CBC w Diff:  Lab Results  Component Value Date   WBC 6.0 03/28/2018   HGB 11.3 (L) 03/28/2018   HGB 8.9 (L) 10/29/2013   HCT 35.3 (L) 03/28/2018   HCT 38.8 10/16/2013   PLT 229 03/28/2018   PLT 216 10/29/2013    CMP:  Lab Results  Component Value Date   NA 140 03/29/2018   NA 138 10/29/2013   K 4.1 03/29/2018   K 3.8 10/29/2013   CL 113 (H) 03/29/2018   CL 106 10/29/2013   CO2 20 (L) 03/29/2018   CO2 27 10/29/2013   BUN 16 03/29/2018   BUN 5 (L) 10/29/2013   CREATININE 0.74 03/29/2018   CREATININE 0.62 10/29/2013   PROT 5.4 (L) 03/29/2018   ALBUMIN 3.0 (L) 03/29/2018   BILITOT 0.9 03/29/2018   ALKPHOS 66 03/29/2018   AST 53 (H) 03/29/2018   ALT 111 (H) 03/29/2018  .   Total Time in preparing paper work, data evaluation and todays exam - 35 minutes  Katha Hamming M.D o 12 -13-20 19 at 8:18 PM    Note: This dictation was prepared with Dragon dictation along with smaller phrase technology. Any transcriptional errors that result from this process are unintentional.

## 2018-04-03 NOTE — Anesthesia Postprocedure Evaluation (Signed)
Anesthesia Post Note  Patient: Ellen [name]  Procedure(s) Performed: LAPAROSCOPIC CHOLECYSTECTOMY WITH INTRAOPERATIVE CHOLANGIOGRAM (N/A Abdomen)  Patient location during evaluation: PACU Anesthesia Type: General Level of consciousness: awake and alert Pain management: pain level controlled Vital Signs Assessment: post-procedure vital signs reviewed and stable Respiratory status: spontaneous breathing, nonlabored ventilation, respiratory function stable and patient connected to nasal cannula oxygen Cardiovascular status: blood pressure returned to baseline and stable Postop Assessment: no apparent nausea or vomiting Anesthetic complications: no     Last Vitals:  Vitals:   03/28/18 1919 03/29/18 0351  BP: (!) 101/48 (!) 157/66  Pulse: 76 85  Resp: 18 18  Temp: 36.8 C 37.1 C  SpO2: 96% 98%    Last Pain:  Vitals:   03/29/18 0735  TempSrc:   PainSc: 4                  Yevette EdwardsJames G Adams

## 2018-04-11 ENCOUNTER — Encounter: Payer: Self-pay | Admitting: Surgery

## 2018-04-16 ENCOUNTER — Encounter: Payer: Self-pay | Admitting: Surgery

## 2018-04-16 ENCOUNTER — Other Ambulatory Visit: Payer: Self-pay

## 2018-04-16 ENCOUNTER — Ambulatory Visit (INDEPENDENT_AMBULATORY_CARE_PROVIDER_SITE_OTHER): Payer: Medicare HMO | Admitting: Surgery

## 2018-04-16 VITALS — BP 130/76 | HR 72 | Temp 97.8°F | Resp 13 | Ht 61.0 in | Wt 196.0 lb

## 2018-04-16 DIAGNOSIS — Z4889 Encounter for other specified surgical aftercare: Secondary | ICD-10-CM

## 2018-04-16 NOTE — Progress Notes (Signed)
Surgical Clinic Progress/Follow-up Note   HPI:  72 y.o. Female presents to clinic for post-op follow-up 19 Days s/p laparoscopic cholecystectomy with intra-operative cholangiogram Earlene Plater(Davis, 03/28/2018) for gallstone pancreatitis. Patient reports complete resolution of pre-operative pain and has been tolerating regular diet with +flatus and normalizing BM's (transiently loose with fatty foods), denies N/V, fever/chills, CP, or SOB.  Review of Systems:  Constitutional: denies fever/chills  Respiratory: denies shortness of breath, wheezing  Cardiovascular: denies chest pain, palpitations  Gastrointestinal: abdominal pain, N/V, and bowel function as per interval history Skin: Denies any other rashes or skin discolorations except post-surgical wounds as per interval history  Vital Signs:  BP 130/76   Pulse 72   Temp 97.8 F (36.6 C) (Skin)   Resp 13   Ht 5\' 1"  (1.549 m)   Wt 196 lb (88.9 kg)   SpO2 98%   BMI 37.03 kg/m    Physical Exam:  Constitutional:  -- Obese body habitus  -- Awake, alert, and oriented x3  Pulmonary:  -- No crackles -- Equal breath sounds bilaterally -- Breathing non-labored at rest Cardiovascular:  -- S1, S2 present  -- No pericardial rubs  Gastrointestinal:  -- Soft and non-distended, non-tender to palpation, no guarding/rebound tenderness -- Post-surgical incisions all well-approximated without any peri-incisional erythema or drainage -- No abdominal masses appreciated, pulsatile or otherwise  Musculoskeletal / Integumentary:  -- Wounds or skin discoloration: None appreciated except post-surgical incisions as described above (GI) -- Extremities: B/L UE and LE FROM, hands and feet warm, no edema   Imaging: No new pertinent imaging available for review  Assessment:  72 y.o. yo Female with a problem list including...  Patient Active Problem List   Diagnosis Date Noted  . Acute pancreatitis 03/26/2018    presents to clinic for post-op follow-up  evaluation, doing well 19 Days s/p laparoscopic cholecystectomy with intra-operative cholangiogram Earlene Plater(Davis, 03/28/2018) for gallstone pancreatitis.  Plan:              - advance diet as tolerated              - okay to submerge incisions under water (baths, swimming) prn             - gradually resume all activities without restrictions over next 2 weeks             - apply sunblock particularly to incisions with sun exposure to reduce pigmentation of scars             - return to clinic as needed, instructed to call office if any questions or concerns  All of the above recommendations were discussed with the patient, and all of patient's questions were answered to her expressed satisfaction.  -- Scherrie GerlachJason E. Earlene Plateravis, MD, RPVI [place]: [facility] General Surgery - Partnering for exceptional care. Office: [phone]

## 2018-04-16 NOTE — Patient Instructions (Signed)
Return as needed.The patient is aware to call back for any questions or concerns.  

## 2018-10-15 ENCOUNTER — Other Ambulatory Visit: Payer: Self-pay | Admitting: Family Medicine

## 2018-10-15 DIAGNOSIS — Z1231 Encounter for screening mammogram for malignant neoplasm of breast: Secondary | ICD-10-CM

## 2018-11-22 ENCOUNTER — Ambulatory Visit
Admission: RE | Admit: 2018-11-22 | Discharge: 2018-11-22 | Disposition: A | Discharge status: Home / Self Care | Payer: Medicare HMO | Source: Ambulatory Visit | Attending: Family Medicine | Admitting: Family Medicine

## 2018-11-22 DIAGNOSIS — Z1231 Encounter for screening mammogram for malignant neoplasm of breast: Secondary | ICD-10-CM | POA: Diagnosis present

## 2019-08-11 ENCOUNTER — Telehealth: Payer: Self-pay

## 2019-08-11 ENCOUNTER — Encounter: Payer: Self-pay | Admitting: Family Medicine

## 2019-08-11 NOTE — Telephone Encounter (Signed)
Patient's Enrico, Ihor Austin "[place]") wife calling to express gratitude toward Dr. Beryle Flock and Osvaldo Angst. She stated that her husband passed away yesterday and just wanted to say thank you to the office staff for taking such good care of her family.

## 2019-08-18 NOTE — Telephone Encounter (Signed)
Thought you might want to see this.

## 2019-10-16 ENCOUNTER — Other Ambulatory Visit: Payer: Self-pay | Admitting: Family Medicine

## 2019-10-16 DIAGNOSIS — R221 Localized swelling, mass and lump, neck: Secondary | ICD-10-CM

## 2019-10-24 ENCOUNTER — Other Ambulatory Visit: Payer: Self-pay

## 2019-10-24 ENCOUNTER — Ambulatory Visit
Admission: RE | Admit: 2019-10-24 | Discharge: 2019-10-24 | Disposition: A | Discharge status: Home / Self Care | Payer: Medicare HMO | Source: Ambulatory Visit | Attending: Family Medicine | Admitting: Family Medicine

## 2019-10-24 DIAGNOSIS — R221 Localized swelling, mass and lump, neck: Secondary | ICD-10-CM | POA: Diagnosis present

## 2020-02-13 ENCOUNTER — Other Ambulatory Visit: Payer: Self-pay | Admitting: Family Medicine

## 2020-02-13 DIAGNOSIS — Z1231 Encounter for screening mammogram for malignant neoplasm of breast: Secondary | ICD-10-CM

## 2020-03-23 ENCOUNTER — Ambulatory Visit
Admission: RE | Admit: 2020-03-23 | Discharge: 2020-03-23 | Disposition: A | Discharge status: Home / Self Care | Payer: Medicare HMO | Source: Ambulatory Visit | Attending: Family Medicine | Admitting: Family Medicine

## 2020-03-23 ENCOUNTER — Other Ambulatory Visit: Payer: Self-pay

## 2020-03-23 DIAGNOSIS — Z1231 Encounter for screening mammogram for malignant neoplasm of breast: Secondary | ICD-10-CM | POA: Diagnosis present

## 2021-01-07 ENCOUNTER — Other Ambulatory Visit: Payer: Self-pay | Admitting: Family Medicine

## 2021-01-07 DIAGNOSIS — Z1231 Encounter for screening mammogram for malignant neoplasm of breast: Secondary | ICD-10-CM

## 2021-02-16 ENCOUNTER — Other Ambulatory Visit: Payer: Self-pay | Admitting: Family Medicine

## 2021-02-16 DIAGNOSIS — Z780 Asymptomatic menopausal state: Secondary | ICD-10-CM

## 2021-02-16 DIAGNOSIS — M62838 Other muscle spasm: Secondary | ICD-10-CM

## 2021-03-24 ENCOUNTER — Other Ambulatory Visit: Payer: Self-pay

## 2021-03-24 ENCOUNTER — Ambulatory Visit
Admission: RE | Admit: 2021-03-24 | Discharge: 2021-03-24 | Disposition: A | Discharge status: Home / Self Care | Payer: Medicare HMO | Source: Ambulatory Visit | Attending: Family Medicine | Admitting: Family Medicine

## 2021-03-24 ENCOUNTER — Ambulatory Visit: Payer: Medicare HMO

## 2021-03-24 DIAGNOSIS — Z1231 Encounter for screening mammogram for malignant neoplasm of breast: Secondary | ICD-10-CM | POA: Insufficient documentation

## 2021-06-01 ENCOUNTER — Other Ambulatory Visit: Payer: Self-pay

## 2021-06-01 ENCOUNTER — Ambulatory Visit
Admission: RE | Admit: 2021-06-01 | Discharge: 2021-06-01 | Disposition: A | Discharge status: Home / Self Care | Payer: Medicare HMO | Source: Ambulatory Visit | Attending: Family Medicine | Admitting: Family Medicine

## 2021-06-01 DIAGNOSIS — M62838 Other muscle spasm: Secondary | ICD-10-CM | POA: Diagnosis present

## 2021-06-01 DIAGNOSIS — Z780 Asymptomatic menopausal state: Secondary | ICD-10-CM | POA: Diagnosis present

## 2021-11-23 NOTE — Discharge Instructions (Signed)

## 2021-11-26 NOTE — Anesthesia Preprocedure Evaluation (Signed)
Anesthesia Evaluation  Patient identified by MRN, date of birth, ID band Patient awake    Reviewed: Allergy & Precautions, NPO status , Patient's Chart, lab work & pertinent test results  History of Anesthesia Complications Negative for: history of anesthetic complications  Airway Mallampati: III  TM Distance: >3 FB Neck ROM: full    Dental  (+) Chipped   Pulmonary neg pulmonary ROS,    Pulmonary exam normal        Cardiovascular hypertension, Pt. on medications Normal cardiovascular exam     Neuro/Psych negative neurological ROS  negative psych ROS   GI/Hepatic negative GI ROS, Neg liver ROS, Fatty liver   Endo/Other  negative endocrine ROS  Renal/GU CRFRenal disease     Musculoskeletal  (+) Arthritis ,   Abdominal (+) + obese,   Peds  Hematology negative hematology ROS (+)   Anesthesia Other Findings Past Medical History: 03/26/2018: Acute pancreatitis No date: Allergic rhinitis No date: Arthritis No date: Hyperlipemia No date: Obesity No date: Varicella  Past Surgical History: 03/28/2018: CHOLECYSTECTOMY; N/A     Comment:  Procedure: LAPAROSCOPIC CHOLECYSTECTOMY WITH               INTRAOPERATIVE CHOLANGIOGRAM;  Surgeon: Vickie Epley, MD;  Location: [facility] ORS;  Service: General;                Laterality: N/A; No date: COLONOSCOPY 02/20/2017: COLONOSCOPY WITH PROPOFOL; N/A     Comment:  Procedure: COLONOSCOPY WITH PROPOFOL;  Surgeon:               Lollie Sails, MD;  Location: [facility] ENDOSCOPY;                Service: Endoscopy;  Laterality: N/A; No date: EYE SURGERY 08/11/2013,10/27/2013: JOINT REPLACEMENT; Bilateral No date: TUBAL LIGATION     Reproductive/Obstetrics negative OB ROS                             Anesthesia Physical Anesthesia Plan  ASA: 2  Anesthesia Plan: MAC   Post-op Pain Management: Minimal or no pain anticipated    Induction: Intravenous  PONV Risk Score and Plan: Treatment may vary due to age or medical condition  Airway Management Planned: Natural Airway and Nasal Cannula  Additional Equipment:   Intra-op Plan:   Post-operative Plan:   Informed Consent: I have reviewed the patients History and Physical, chart, labs and discussed the procedure including the risks, benefits and alternatives for the proposed anesthesia with the patient or authorized representative who has indicated his/her understanding and acceptance.       Plan Discussed with: CRNA  Anesthesia Plan Comments:        Anesthesia Quick Evaluation

## 2021-11-29 ENCOUNTER — Other Ambulatory Visit: Payer: Self-pay

## 2021-11-29 ENCOUNTER — Encounter: Payer: Self-pay | Admitting: Ophthalmology

## 2021-11-29 ENCOUNTER — Ambulatory Visit: Payer: Medicare HMO | Admitting: Anesthesiology

## 2021-11-29 ENCOUNTER — Ambulatory Visit
Admission: RE | Admit: 2021-11-29 | Discharge: 2021-11-29 | Disposition: A | Discharge status: Home / Self Care | Payer: Medicare HMO | Attending: Ophthalmology | Admitting: Ophthalmology

## 2021-11-29 ENCOUNTER — Encounter
Admission: RE | Disposition: A | Discharge status: Home / Self Care | Payer: Self-pay | Source: Home / Self Care | Attending: Ophthalmology

## 2021-11-29 DIAGNOSIS — H2511 Age-related nuclear cataract, right eye: Secondary | ICD-10-CM | POA: Diagnosis present

## 2021-11-29 DIAGNOSIS — N189 Chronic kidney disease, unspecified: Secondary | ICD-10-CM | POA: Diagnosis not present

## 2021-11-29 DIAGNOSIS — I129 Hypertensive chronic kidney disease with stage 1 through stage 4 chronic kidney disease, or unspecified chronic kidney disease: Secondary | ICD-10-CM | POA: Insufficient documentation

## 2021-11-29 HISTORY — PX: CATARACT EXTRACTION W/PHACO: SHX586

## 2021-11-29 SURGERY — PHACOEMULSIFICATION, CATARACT, WITH IOL INSERTION
Anesthesia: Monitor Anesthesia Care | Site: Eye | Laterality: Right

## 2021-11-29 MED ORDER — TETRACAINE HCL 0.5 % OP SOLN
1.0000 [drp] | OPHTHALMIC | Status: DC | PRN
  Administered 2021-11-29 (×3): 1 [drp] via OPHTHALMIC

## 2021-11-29 MED ORDER — SIGHTPATH DOSE#1 BSS IO SOLN
INTRAOCULAR | Status: DC | PRN
  Administered 2021-11-29: 15 mL

## 2021-11-29 MED ORDER — SIGHTPATH DOSE#1 BSS IO SOLN
INTRAOCULAR | Status: DC | PRN
  Administered 2021-11-29: 1 mL via INTRAMUSCULAR

## 2021-11-29 MED ORDER — SIGHTPATH DOSE#1 NA CHONDROIT SULF-NA HYALURON 40-17 MG/ML IO SOLN
INTRAOCULAR | Status: DC | PRN
  Administered 2021-11-29: 1 mL via INTRAOCULAR

## 2021-11-29 MED ORDER — BRIMONIDINE TARTRATE-TIMOLOL 0.2-0.5 % OP SOLN
OPHTHALMIC | Status: DC | PRN
  Administered 2021-11-29: 1 [drp] via OPHTHALMIC

## 2021-11-29 MED ORDER — MOXIFLOXACIN HCL 0.5 % OP SOLN
OPHTHALMIC | Status: DC | PRN
  Administered 2021-11-29: 0.2 mL via OPHTHALMIC

## 2021-11-29 MED ORDER — ARMC OPHTHALMIC DILATING DROPS
1.0000 | OPHTHALMIC | Status: DC | PRN
  Administered 2021-11-29 (×3): 1 via OPHTHALMIC

## 2021-11-29 MED ORDER — SIGHTPATH DOSE#1 BSS IO SOLN
INTRAOCULAR | Status: DC | PRN
  Administered 2021-11-29: 69 mL via OPHTHALMIC

## 2021-11-29 MED ORDER — MIDAZOLAM HCL 2 MG/2ML IJ SOLN
INTRAMUSCULAR | Status: DC | PRN
  Administered 2021-11-29: .5 mg via INTRAVENOUS

## 2021-11-29 MED ORDER — FENTANYL CITRATE (PF) 100 MCG/2ML IJ SOLN
INTRAMUSCULAR | Status: DC | PRN
  Administered 2021-11-29 (×2): 50 ug via INTRAVENOUS

## 2021-11-29 SURGICAL SUPPLY — 10 items
CATARACT SUITE SIGHTPATH (MISCELLANEOUS) ×2 IMPLANT
FEE CATARACT SUITE SIGHTPATH (MISCELLANEOUS) ×1 IMPLANT
GLOVE SURG ENC TEXT LTX SZ8 (GLOVE) ×2 IMPLANT
GLOVE SURG TRIUMPH 8.0 PF LTX (GLOVE) ×2 IMPLANT
LENS IOL ACRYSOF IQ 21.0 (Intraocular Lens) ×1 IMPLANT
NDL FILTER BLUNT 18X1 1/2 (NEEDLE) ×1 IMPLANT
NEEDLE FILTER BLUNT 18X 1/2SAF (NEEDLE) ×1
NEEDLE FILTER BLUNT 18X1 1/2 (NEEDLE) ×1 IMPLANT
SYR 3ML LL SCALE MARK (SYRINGE) ×2 IMPLANT
WATER STERILE IRR 250ML POUR (IV SOLUTION) ×2 IMPLANT

## 2021-11-29 NOTE — Transfer of Care (Signed)
Immediate Anesthesia Transfer of Care Note  Patient: Ellen [name]  Procedure(s) Performed: CATARACT EXTRACTION PHACO AND INTRAOCULAR LENS PLACEMENT (IOC) RIGHT (Right: Eye)  Patient Location: PACU  Anesthesia Type: MAC  Level of Consciousness: awake, alert  and patient cooperative  Airway and Oxygen Therapy: Patient Spontanous Breathing and Patient connected to supplemental oxygen  Post-op Assessment: Post-op Vital signs reviewed, Patient's Cardiovascular Status Stable, Respiratory Function Stable, Patent Airway and No signs of Nausea or vomiting  Post-op Vital Signs: Reviewed and stable  Complications: There were no known notable events for this encounter.

## 2021-11-29 NOTE — Anesthesia Postprocedure Evaluation (Signed)
Anesthesia Post Note  Patient: Ellen [name]  Procedure(s) Performed: CATARACT EXTRACTION PHACO AND INTRAOCULAR LENS PLACEMENT (IOC) RIGHT (Right: Eye)     Patient location during evaluation: PACU Anesthesia Type: MAC Level of consciousness: awake and alert Pain management: pain level controlled Vital Signs Assessment: post-procedure vital signs reviewed and stable Respiratory status: spontaneous breathing, nonlabored ventilation and respiratory function stable Cardiovascular status: blood pressure returned to baseline and stable Postop Assessment: no apparent nausea or vomiting Anesthetic complications: no   There were no known notable events for this encounter.  [place] Ouch

## 2021-11-29 NOTE — H&P (Signed)
[facility]   Primary Care Physician:  Rayetta Humphrey, MD Ophthalmologist: Dr. Druscilla Brownie  Pre-Procedure History & Physical: HPI:  Ellen [name] is a 76 y.o. female here for cataract surgery.   Past Medical History:  Diagnosis Date   Acute pancreatitis 03/26/2018   Allergic rhinitis    Arthritis    Hyperlipemia    Obesity    Varicella     Past Surgical History:  Procedure Laterality Date   CHOLECYSTECTOMY N/A 03/28/2018   Procedure: LAPAROSCOPIC CHOLECYSTECTOMY WITH INTRAOPERATIVE CHOLANGIOGRAM;  Surgeon: Ancil Linsey, MD;  Location: [facility] ORS;  Service: General;  Laterality: N/A;   COLONOSCOPY     COLONOSCOPY WITH PROPOFOL N/A 02/20/2017   Procedure: COLONOSCOPY WITH PROPOFOL;  Surgeon: Christena Deem, MD;  Location: [facility] ENDOSCOPY;  Service: Endoscopy;  Laterality: N/A;   EYE SURGERY     JOINT REPLACEMENT Bilateral 08/11/2013,10/27/2013   TUBAL LIGATION      Prior to Admission medications   Medication Sig Start Date End Date Taking? Authorizing Provider  acetaminophen (TYLENOL) 500 MG tablet Take 500-1,000 mg by mouth every 6 (six) hours as needed for mild pain or moderate pain.    Yes [provider]  aspirin EC 81 MG tablet Take 81 mg daily by mouth.   Yes [provider]  cholecalciferol (VITAMIN D) 1000 units tablet Take 5,000 Units by mouth daily.    Yes [provider]  enalapril (VASOTEC) 10 MG tablet Take 10 mg daily by mouth.   Yes [provider]  fluticasone (FLONASE) 50 MCG/ACT nasal spray Place 2 sprays into both nostrils daily as needed for allergies or rhinitis.    Yes [provider]  meloxicam (MOBIC) 7.5 MG tablet Take 7.5 mg by mouth daily.   Yes [provider]  simvastatin (ZOCOR) 10 MG tablet Take 10 mg daily by mouth.   Yes [provider]    Allergies as of 10/10/2021   (No Known Allergies)    Family History  Problem Relation Age of Onset   Breast cancer Sister 32     Social History   Socioeconomic History   Marital status: Widowed    Spouse name: Not on file   Number of children: Not on file   Years of education: Not on file   Highest education level: Not on file  Occupational History   Not on file  Tobacco Use   Smoking status: Never   Smokeless tobacco: Never  Vaping Use   Vaping Use: Never used  Substance and Sexual Activity   Alcohol use: No   Drug use: No   Sexual activity: Not on file  Other Topics Concern   Not on file  Social History Narrative   Not on file   Social Determinants of Health   Financial Resource Strain: Not on file  Food Insecurity: Not on file  Transportation Needs: Not on file  Physical Activity: Not on file  Stress: Not on file  Social Connections: Not on file  Intimate Partner Violence: Not on file    Review of Systems: See HPI, otherwise negative ROS  Physical Exam: BP 137/62   Pulse 86   Temp 97.7 F (36.5 C) (Temporal)   Wt 86.2 kg   SpO2 94%   BMI 35.90 kg/m  General:   Alert, cooperative in NAD Head:  Normocephalic and atraumatic. Respiratory:  Normal work of breathing. Cardiovascular:  RRR  Impression/Plan: Ellen [name] is here for cataract surgery.  Risks, benefits,  limitations, and alternatives regarding cataract surgery have been reviewed with the patient.  Questions have been answered.  All parties agreeable.   Galen Manila, MD  11/29/2021, 7:20 AM

## 2021-11-29 NOTE — Op Note (Signed)
PREOPERATIVE DIAGNOSIS:  Nuclear sclerotic cataract of the right eye.   POSTOPERATIVE DIAGNOSIS:  H25.11  CATARACT   OPERATIVE PROCEDURE:ORPROCALL@   SURGEON:  Galen Manila, MD.   ANESTHESIA:  Anesthesiologist: Foye Deer, MD CRNA: Sharyn Dross, CRNA  1.      Managed anesthesia care. 2.      0.55ml of Shugarcaine was instilled in the eye following the paracentesis.   COMPLICATIONS:  None.   TECHNIQUE:   Stop and chop   DESCRIPTION OF PROCEDURE:  The patient was examined and consented in the preoperative holding area where the aforementioned topical anesthesia was applied to the right eye and then brought back to the Operating Room where the right eye was prepped and draped in the usual sterile ophthalmic fashion and a lid speculum was placed. A paracentesis was created with the side port blade and the anterior chamber was filled with viscoelastic. A near clear corneal incision was performed with the steel keratome. A continuous curvilinear capsulorrhexis was performed with a cystotome followed by the capsulorrhexis forceps. Hydrodissection and hydrodelineation were carried out with BSS on a blunt cannula. The lens was removed in a stop and chop  technique and the remaining cortical material was removed with the irrigation-aspiration handpiece. The capsular bag was inflated with viscoelastic and the Technis [id number]  lens was placed in the capsular bag without complication. The remaining viscoelastic was removed from the eye with the irrigation-aspiration handpiece. The wounds were hydrated. The anterior chamber was flushed with BSS and the eye was inflated to physiologic pressure. 0.28ml of Vigamox was placed in the anterior chamber. The wounds were found to be water tight. The eye was dressed with Combigan. The patient was given protective glasses to wear throughout the day and a shield with which to sleep tonight. The patient was also given drops with which to begin a drop regimen  today and will follow-up with me in one day. Implant Name Type Inv. Item Serial No. Manufacturer Lot No. LRB No. Used Action  LENS IOL ACRYSOF IQ 21.0 - [id number] Intraocular Lens LENS IOL ACRYSOF IQ 21.0 [id number] ALCON  Right 1 Implanted   Procedure(s) with comments: CATARACT EXTRACTION PHACO AND INTRAOCULAR LENS PLACEMENT (IOC) RIGHT (Right) - 7.59 00:47.6  Electronically signed: Galen Manila 11/29/2021 7:50 AM

## 2021-11-30 ENCOUNTER — Encounter: Payer: Self-pay | Admitting: Ophthalmology

## 2022-06-07 ENCOUNTER — Other Ambulatory Visit: Payer: Self-pay | Admitting: Family Medicine

## 2022-06-07 DIAGNOSIS — Z1231 Encounter for screening mammogram for malignant neoplasm of breast: Secondary | ICD-10-CM

## 2022-06-28 ENCOUNTER — Ambulatory Visit
Admission: RE | Admit: 2022-06-28 | Discharge: 2022-06-28 | Disposition: A | Discharge status: Home / Self Care | Payer: Medicare HMO | Source: Ambulatory Visit | Attending: Family Medicine | Admitting: Family Medicine

## 2022-06-28 DIAGNOSIS — Z1231 Encounter for screening mammogram for malignant neoplasm of breast: Secondary | ICD-10-CM | POA: Diagnosis not present

## 2023-07-06 ENCOUNTER — Other Ambulatory Visit: Payer: Self-pay | Admitting: Family Medicine

## 2023-07-06 DIAGNOSIS — Z1231 Encounter for screening mammogram for malignant neoplasm of breast: Secondary | ICD-10-CM

## 2023-07-17 ENCOUNTER — Ambulatory Visit
Admission: RE | Admit: 2023-07-17 | Discharge: 2023-07-17 | Disposition: A | Discharge status: Home / Self Care | Source: Ambulatory Visit | Attending: Family Medicine | Admitting: Family Medicine

## 2023-07-17 DIAGNOSIS — Z1231 Encounter for screening mammogram for malignant neoplasm of breast: Secondary | ICD-10-CM | POA: Insufficient documentation

## 2023-08-14 IMAGING — MG MM DIGITAL SCREENING BILAT W/ TOMO AND CAD
6 of 12 series · 6 of 36 positions shown · non-contrast
Comparison: Previous exam(s).

CLINICAL DATA: Screening.

EXAM:
DIGITAL SCREENING BILATERAL MAMMOGRAM WITH TOMOSYNTHESIS AND CAD
TECHNIQUE: Bilateral screening digital craniocaudal and mediolateral oblique
mammograms were obtained. Bilateral screening digital breast
tomosynthesis was performed. The images were evaluated with
computer-aided detection.

[L CV synth-2D]
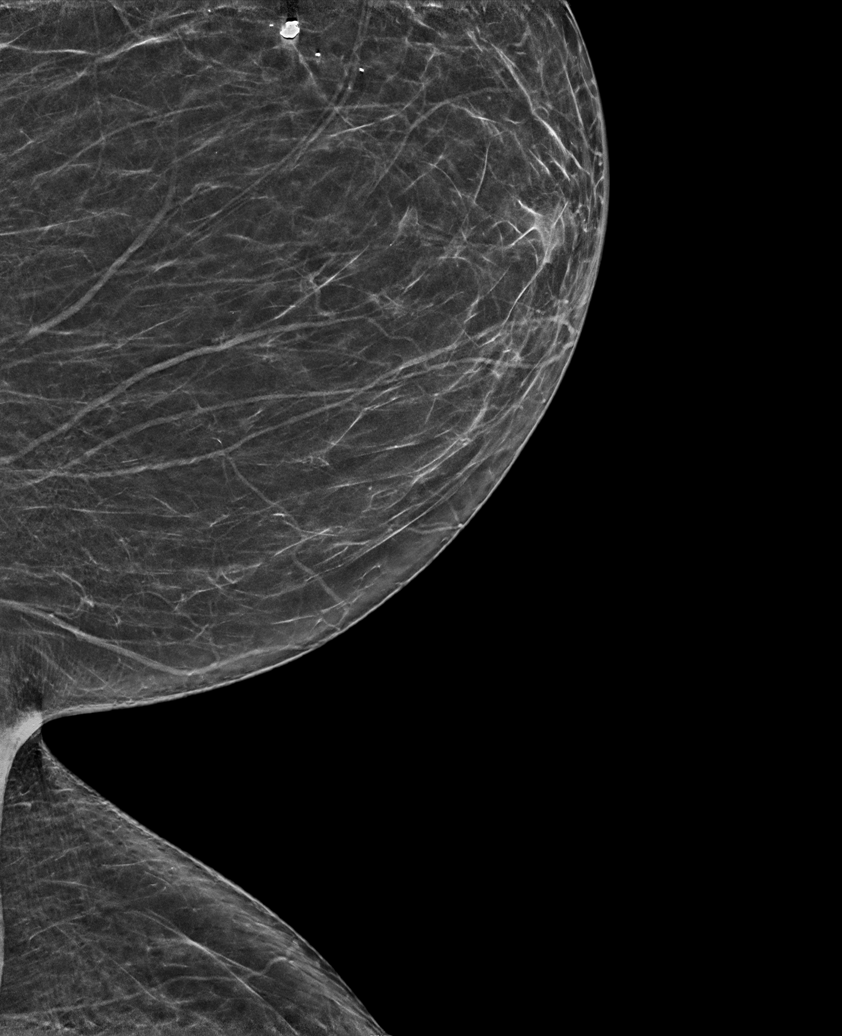

[R MLO synth-2D]
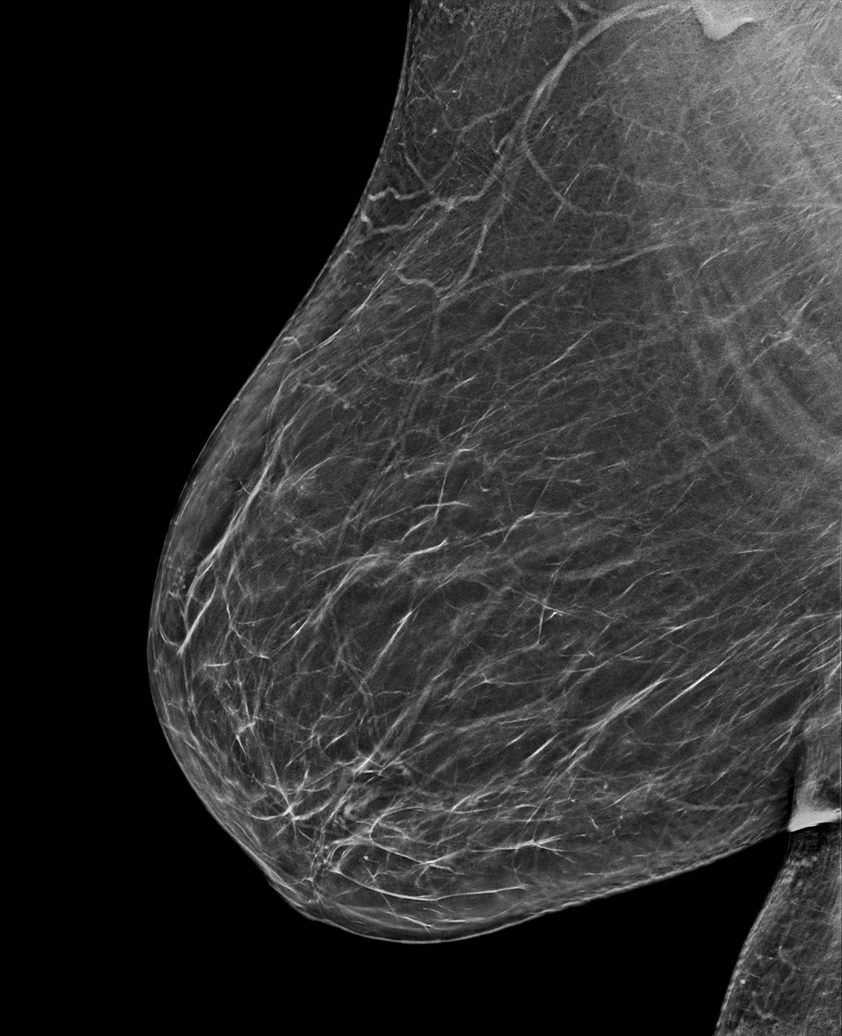

[L MLO synth-2D (1 of 2)]
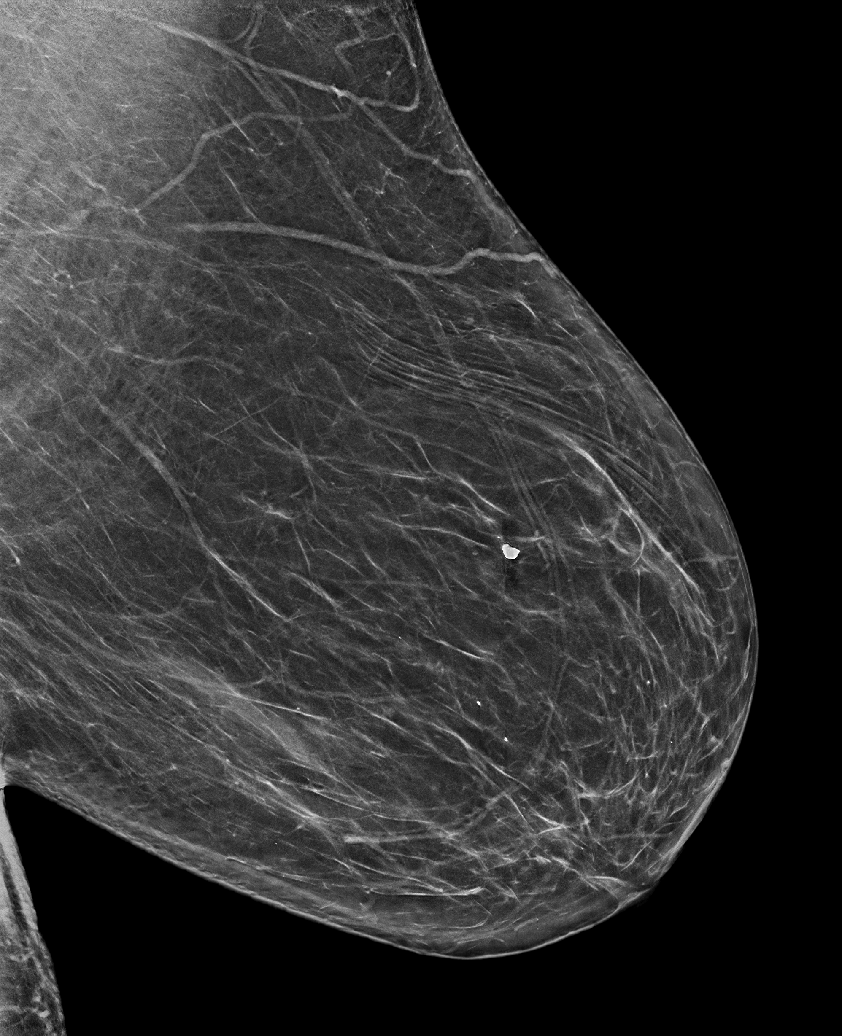

[L CC synth-2D]
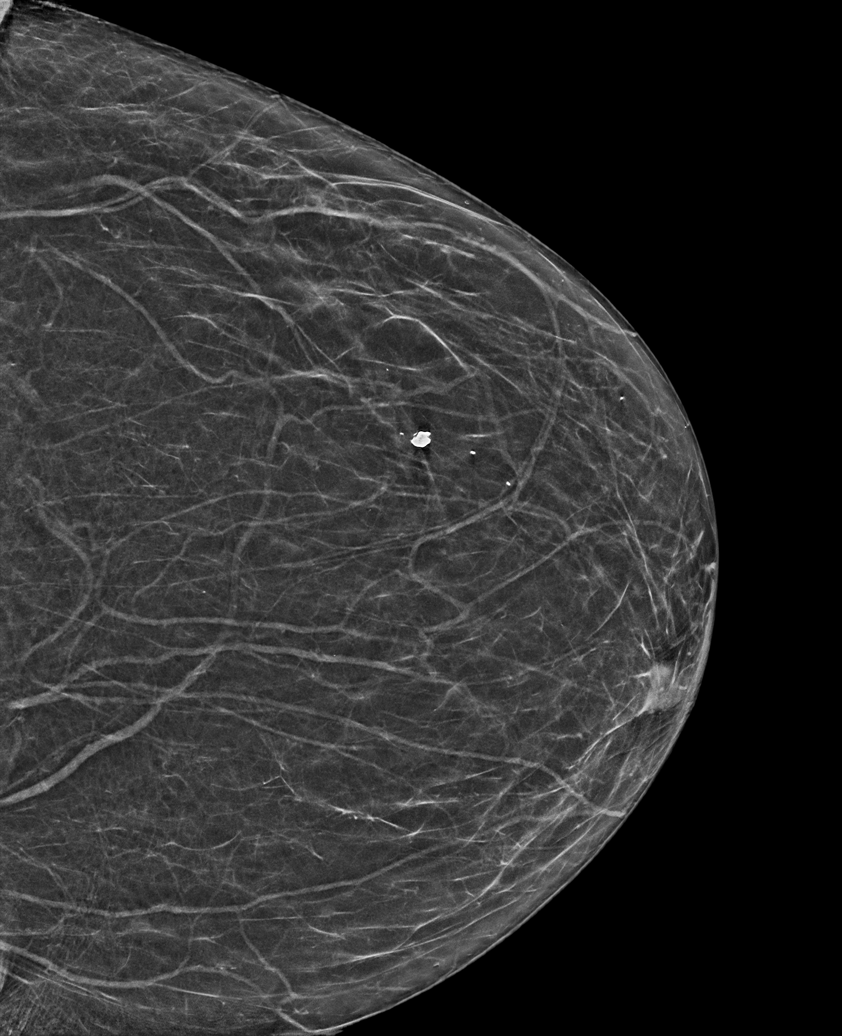

[R CC synth-2D]
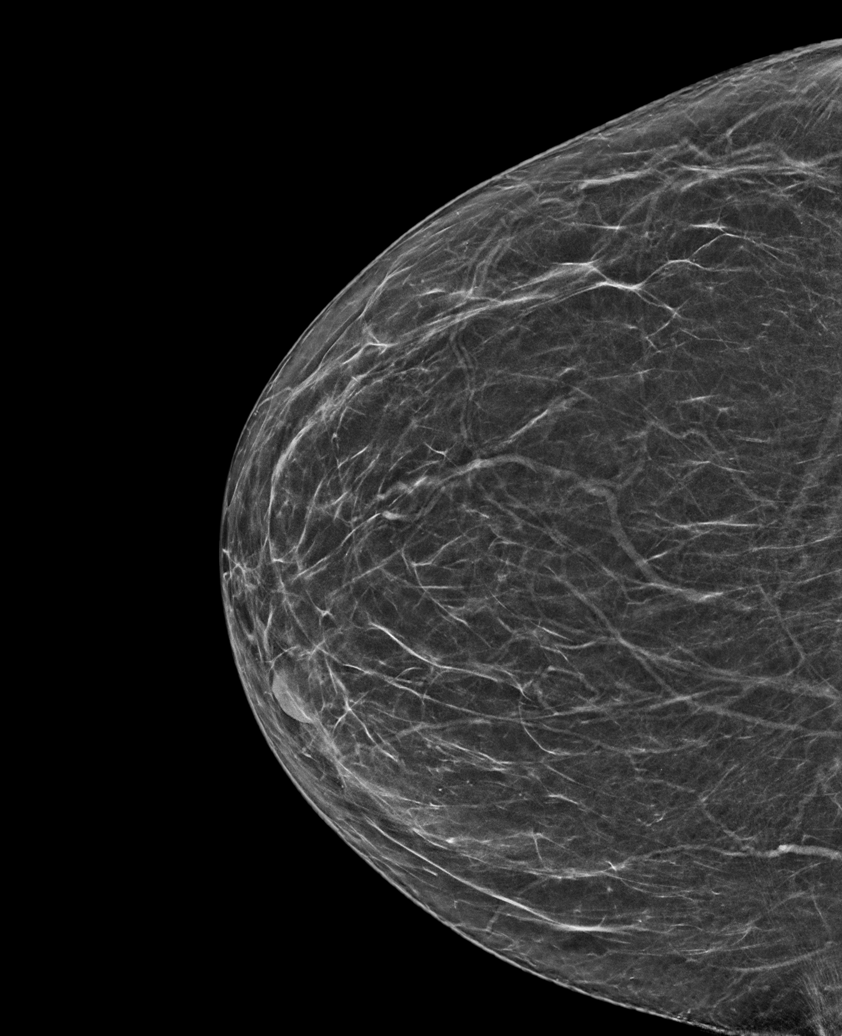

[L MLO synth-2D (2 of 2)]
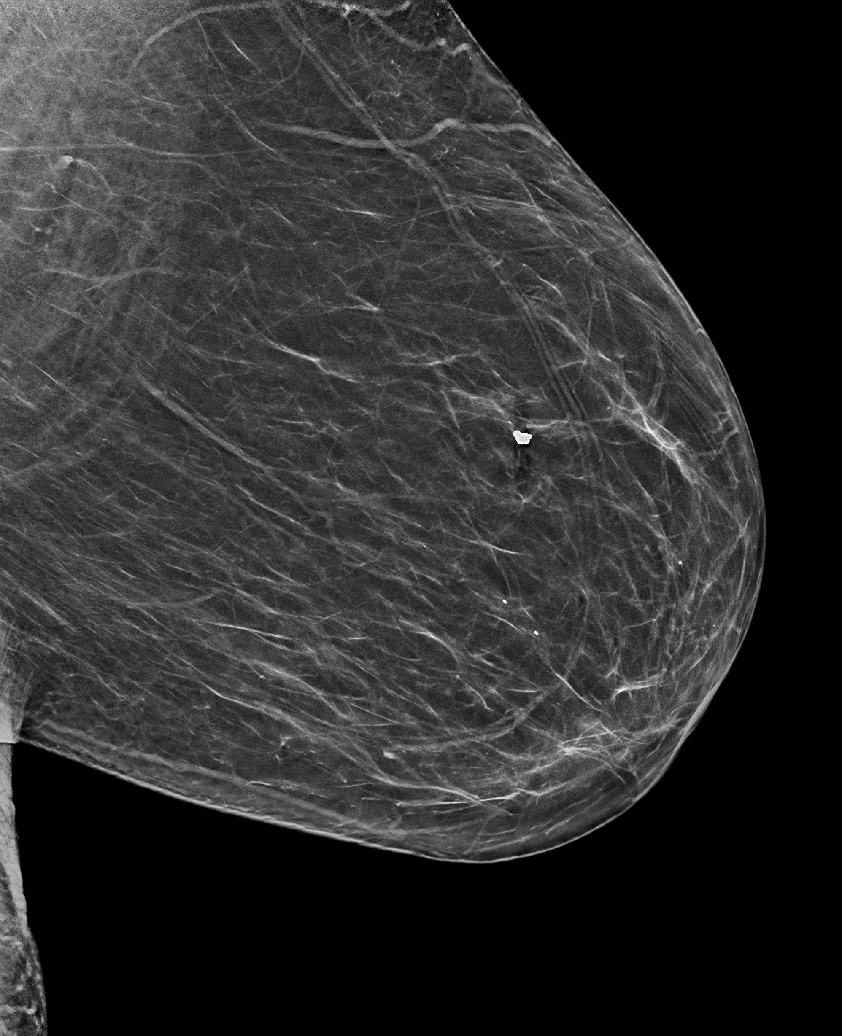

[6 of 36 positions shown; findings below may reference images not displayed]

ACR Breast Density Category b: There are scattered areas of
fibroglandular density.
FINDINGS: There are no findings suspicious for malignancy.
IMPRESSION: No mammographic evidence of malignancy. A result letter of this
screening mammogram will be mailed directly to the patient.

RECOMMENDATION:
Screening mammogram in one year. (Code:51-O-LD2)

BI-RADS CATEGORY  1: Negative.
